# Patient Record
Sex: Female | Born: 1940 | Race: Black or African American | Hispanic: No | Smoking: Never smoker
Health system: Southern US, Community
[De-identification: ages and names within clinical notes are randomized; demographics above are authoritative.]

## PROBLEM LIST (undated history)

## (undated) DIAGNOSIS — M1990 Unspecified osteoarthritis, unspecified site: Secondary | ICD-10-CM

## (undated) DIAGNOSIS — E119 Type 2 diabetes mellitus without complications: Secondary | ICD-10-CM

## (undated) DIAGNOSIS — I10 Essential (primary) hypertension: Secondary | ICD-10-CM

## (undated) DIAGNOSIS — J449 Chronic obstructive pulmonary disease, unspecified: Secondary | ICD-10-CM

## (undated) HISTORY — PX: COLON SURGERY: SHX602

## (undated) HISTORY — PX: ABDOMINAL HYSTERECTOMY: SHX81

## (undated) HISTORY — PX: BREAST SURGERY: SHX581

---

## 2004-03-22 ENCOUNTER — Encounter: Admission: RE | Admit: 2004-03-22 | Discharge: 2004-03-22 | Payer: Self-pay | Admitting: Internal Medicine

## 2017-08-20 ENCOUNTER — Other Ambulatory Visit: Payer: Self-pay

## 2017-08-20 ENCOUNTER — Emergency Department (HOSPITAL_BASED_OUTPATIENT_CLINIC_OR_DEPARTMENT_OTHER)
Admission: EM | Admit: 2017-08-20 | Discharge: 2017-08-20 | Disposition: A | Discharge status: Home / Self Care | Payer: Medicare Other | Attending: Emergency Medicine | Admitting: Emergency Medicine

## 2017-08-20 ENCOUNTER — Emergency Department (HOSPITAL_BASED_OUTPATIENT_CLINIC_OR_DEPARTMENT_OTHER): Payer: Medicare Other

## 2017-08-20 ENCOUNTER — Encounter (HOSPITAL_BASED_OUTPATIENT_CLINIC_OR_DEPARTMENT_OTHER): Payer: Self-pay | Admitting: Emergency Medicine

## 2017-08-20 DIAGNOSIS — M545 Low back pain: Secondary | ICD-10-CM | POA: Diagnosis present

## 2017-08-20 DIAGNOSIS — E119 Type 2 diabetes mellitus without complications: Secondary | ICD-10-CM | POA: Insufficient documentation

## 2017-08-20 DIAGNOSIS — J449 Chronic obstructive pulmonary disease, unspecified: Secondary | ICD-10-CM | POA: Diagnosis not present

## 2017-08-20 DIAGNOSIS — I10 Essential (primary) hypertension: Secondary | ICD-10-CM | POA: Insufficient documentation

## 2017-08-20 DIAGNOSIS — N12 Tubulo-interstitial nephritis, not specified as acute or chronic: Secondary | ICD-10-CM | POA: Diagnosis not present

## 2017-08-20 DIAGNOSIS — Z794 Long term (current) use of insulin: Secondary | ICD-10-CM | POA: Diagnosis not present

## 2017-08-20 HISTORY — DX: Unspecified osteoarthritis, unspecified site: M19.90

## 2017-08-20 HISTORY — DX: Chronic obstructive pulmonary disease, unspecified: J44.9

## 2017-08-20 HISTORY — DX: Type 2 diabetes mellitus without complications: E11.9

## 2017-08-20 HISTORY — DX: Essential (primary) hypertension: I10

## 2017-08-20 LAB — COMPREHENSIVE METABOLIC PANEL
ALT: 16 U/L (ref 14–54)
AST: 17 U/L (ref 15–41)
Albumin: 3.5 g/dL (ref 3.5–5.0)
Alkaline Phosphatase: 79 U/L (ref 38–126)
Anion gap: 9 (ref 5–15)
BUN: 11 mg/dL (ref 6–20)
CO2: 25 mmol/L (ref 22–32)
Calcium: 8.6 mg/dL — ABNORMAL LOW (ref 8.9–10.3)
Chloride: 99 mmol/L — ABNORMAL LOW (ref 101–111)
Creatinine, Ser: 0.88 mg/dL (ref 0.44–1.00)
GFR calc Af Amer: >60 mL/min (ref >60)
GFR calc non Af Amer: >60 mL/min (ref >60)
Glucose, Bld: 253 mg/dL — ABNORMAL HIGH (ref 65–99)
Potassium: 3.4 mmol/L — ABNORMAL LOW (ref 3.5–5.1)
Sodium: 133 mmol/L — ABNORMAL LOW (ref 135–145)
Total Bilirubin: 0.5 mg/dL (ref 0.3–1.2)
Total Protein: 7.1 g/dL (ref 6.5–8.1)

## 2017-08-20 LAB — URINALYSIS, MICROSCOPIC (REFLEX)

## 2017-08-20 LAB — CBC WITH DIFFERENTIAL/PLATELET
Basophils Absolute: 0 10*3/uL (ref 0.0–0.1)
Basophils Relative: 0 %
Eosinophils Absolute: 0.1 10*3/uL (ref 0.0–0.7)
Eosinophils Relative: 1 %
HCT: 33.5 % — ABNORMAL LOW (ref 36.0–46.0)
Hemoglobin: 10.8 g/dL — ABNORMAL LOW (ref 12.0–15.0)
Lymphocytes Relative: 19 %
Lymphs Abs: 2.1 10*3/uL (ref 0.7–4.0)
MCH: 21.6 pg — ABNORMAL LOW (ref 26.0–34.0)
MCHC: 32.2 g/dL (ref 30.0–36.0)
MCV: 67.1 fL — ABNORMAL LOW (ref 78.0–100.0)
Monocytes Absolute: 1.2 10*3/uL — ABNORMAL HIGH (ref 0.1–1.0)
Monocytes Relative: 11 %
Neutro Abs: 7.9 10*3/uL — ABNORMAL HIGH (ref 1.7–7.7)
Neutrophils Relative %: 69 %
Platelets: 212 10*3/uL (ref 150–400)
RBC: 4.99 MIL/uL (ref 3.87–5.11)
RDW: 15.1 % (ref 11.5–15.5)
WBC: 11.3 10*3/uL — ABNORMAL HIGH (ref 4.0–10.5)

## 2017-08-20 LAB — URINALYSIS, ROUTINE W REFLEX MICROSCOPIC
Bilirubin Urine: NEGATIVE
Glucose, UA: >=500 mg/dL — AB
Hgb urine dipstick: NEGATIVE
Ketones, ur: NEGATIVE mg/dL
Leukocytes, UA: NEGATIVE
Nitrite: NEGATIVE
Protein, ur: 30 mg/dL — AB
Specific Gravity, Urine: 1.02 (ref 1.005–1.030)
pH: 7.5 (ref 5.0–8.0)

## 2017-08-20 LAB — LIPASE, BLOOD: Lipase: 22 U/L (ref 11–51)

## 2017-08-20 MED ORDER — CIPROFLOXACIN HCL 500 MG PO TABS: 500.0000 mg | ORAL_TABLET | Freq: Two times a day (BID) | ORAL | 0 refills | Status: AC

## 2017-08-20 MED ORDER — FENTANYL CITRATE (PF) 100 MCG/2ML IJ SOLN
50.0000 ug | Freq: Once | INTRAMUSCULAR | Status: AC
  Administered 2017-08-20: 50 ug via INTRAVENOUS
  Filled 2017-08-20: qty 2

## 2017-08-20 MED ORDER — CIPROFLOXACIN HCL 500 MG PO TABS
500.0000 mg | ORAL_TABLET | Freq: Once | ORAL | Status: AC
  Administered 2017-08-20: 500 mg via ORAL
  Filled 2017-08-20: qty 1

## 2017-08-20 NOTE — ED Provider Notes (Signed)
[facility] EMERGENCY DEPARTMENT Provider Note   CSN: [id number] Arrival date & time: 08/20/17  1648     History   Chief Complaint Chief Complaint  Patient presents with  . Back Pain    HPI Beth Pope is a 77 y.o. female.  The history is provided by the patient, a relative and medical records.  Back Pain   This is a new problem. The current episode started more than 2 days ago. The problem occurs constantly. The problem has not changed since onset.The pain is associated with no known injury. The pain is present in the lumbar spine. The quality of the pain is described as aching. The pain does not radiate. The pain is moderate. The pain is the same all the time. Associated symptoms include a fever (sunjective). Pertinent negatives include no chest pain, no numbness, no headaches, no abdominal pain, no abdominal swelling, no bowel incontinence, no dysuria (foul smelling urine and darker urine), no leg pain, no paresthesias, no paresis, no tingling and no weakness. She has tried nothing for the symptoms. The treatment provided no relief.    Past Medical History:  Diagnosis Date  . Arthritis   . COPD (chronic obstructive pulmonary disease) (HCC)   . Diabetes mellitus without complication (HCC)   . Hypertension     There are no active problems to display for this patient.   Past Surgical History:  Procedure Laterality Date  . ABDOMINAL HYSTERECTOMY    . BREAST SURGERY    . COLON SURGERY      OB History    No data available       Home Medications    Prior to Admission medications   Medication Sig Start Date End Date Taking? Authorizing Provider  albuterol (PROVENTIL HFA;VENTOLIN HFA) 108 (90 Base) MCG/ACT inhaler Inhale into the lungs every 6 (six) hours as needed for wheezing or shortness of breath.   Yes [provider]  amLODipine (NORVASC) 5 MG tablet Take 5 mg by mouth daily.   Yes [provider]  insulin lispro protamine-lispro  (HUMALOG 75/25 MIX) (75-25) 100 UNIT/ML SUSP injection Inject into the skin.   Yes [provider]    Family History History reviewed. No pertinent family history.  Social History Social History   Tobacco Use  . Smoking status: Never Smoker  . Smokeless tobacco: Never Used  Substance Use Topics  . Alcohol use: No    Frequency: Never  . Drug use: No     Allergies   Penicillins; Sulfa antibiotics; and Trulicity [dulaglutide]   Review of Systems Review of Systems  Constitutional: Positive for fever (sunjective). Negative for chills, diaphoresis and fatigue.  HENT: Positive for congestion, rhinorrhea and sneezing.   Respiratory: Positive for cough. Negative for chest tightness, shortness of breath, wheezing and stridor.   Cardiovascular: Negative for chest pain, palpitations and leg swelling.  Gastrointestinal: Negative for abdominal pain, bowel incontinence, constipation, diarrhea, nausea and vomiting.  Genitourinary: Positive for frequency and urgency. Negative for decreased urine volume, difficulty urinating and dysuria (foul smelling urine and darker urine).  Musculoskeletal: Positive for back pain. Negative for neck pain and neck stiffness.  Skin: Negative for rash and wound.  Neurological: Negative for tingling, weakness, light-headedness, numbness, headaches and paresthesias.  Psychiatric/Behavioral: Negative for agitation and confusion.  All other systems reviewed and are negative.    Physical Exam Updated Vital Signs Ht 5\' 3"  (1.6 m)   Wt 103.4 kg (228 lb)   BMI 40.39 kg/m  Physical Exam  Constitutional: She is oriented to person, place, and time. She appears well-developed and well-nourished. No distress.  HENT:  Head: Normocephalic and atraumatic.  Nose: Nose normal.  Mouth/Throat: Oropharynx is clear and moist. No oropharyngeal exudate.  Eyes: Conjunctivae and EOM are normal. Pupils are equal, round, and reactive to light.  Neck: Normal range of  motion.  Cardiovascular: Normal rate and intact distal pulses.  No murmur heard. Pulmonary/Chest: Effort normal. No stridor. No respiratory distress. She has no wheezes. She has no rales. She exhibits no tenderness.  Abdominal: Normal appearance and bowel sounds are normal. She exhibits no distension. There is no tenderness.  Musculoskeletal: She exhibits no edema.       Thoracic back: She exhibits tenderness and pain.       Lumbar back: She exhibits tenderness and pain.       Back:  Neurological: She is alert and oriented to person, place, and time. No sensory deficit. She exhibits normal muscle tone.  Skin: Capillary refill takes less than 2 seconds. No rash noted. She is not diaphoretic. No erythema.  Psychiatric: She has a normal mood and affect.  Nursing note and vitals reviewed.    ED Treatments / Results  Labs (all labs ordered are listed, but only abnormal results are displayed) Labs Reviewed  URINALYSIS, ROUTINE W REFLEX MICROSCOPIC - Abnormal; Notable for the following components:      Result Value   Glucose, UA >=500 (*)    Protein, ur 30 (*)    All other components within normal limits  URINALYSIS, MICROSCOPIC (REFLEX) - Abnormal; Notable for the following components:   Bacteria, UA FEW (*)    Squamous Epithelial / LPF TOO NUMEROUS TO COUNT (*)    All other components within normal limits  CBC WITH DIFFERENTIAL/PLATELET - Abnormal; Notable for the following components:   WBC 11.3 (*)    Hemoglobin 10.8 (*)    HCT 33.5 (*)    MCV 67.1 (*)    MCH 21.6 (*)    Neutro Abs 7.9 (*)    Monocytes Absolute 1.2 (*)    All other components within normal limits  COMPREHENSIVE METABOLIC PANEL - Abnormal; Notable for the following components:   Sodium 133 (*)    Potassium 3.4 (*)    Chloride 99 (*)    Glucose, Bld 253 (*)    Calcium 8.6 (*)    All other components within normal limits  URINE CULTURE  LIPASE, BLOOD  CBC WITH DIFFERENTIAL/PLATELET    EKG  EKG  Interpretation None       Radiology Dg Chest 2 View  Result Date: 08/20/2017 CLINICAL DATA:  Cough and fever EXAM: CHEST - 2 VIEW COMPARISON:  Chest radiograph 01/02/2017 FINDINGS: Unchanged cardiomegaly. Bibasilar atelectasis without focal consolidation. No pleural effusion or pneumothorax. Interstitial prominence is compatible with known interstitial lung disease. IMPRESSION: Unchanged appearance of cardiomegaly with findings of interstitial lung disease. No acute cardiopulmonary abnormality. Electronically Signed   By: Deatra RobinsonKevin  Herman M.D.   On: 08/20/2017 19:18    Procedures Procedures (including critical care time)  Medications Ordered in ED Medications  fentaNYL (SUBLIMAZE) injection 50 mcg (50 mcg Intravenous Given 08/20/17 1813)  ciprofloxacin (CIPRO) tablet 500 mg (500 mg Oral Given 08/20/17 2143)     Initial Impression / Assessment and Plan / ED Course  I have reviewed the triage vital signs and the nursing notes.  Pertinent labs & imaging results that were available during my care of the patient were reviewed  by me and considered in my medical decision making (see chart for details).      Beth Pope is a 77 y.o. female with a past medical history significant for hypertension, COPD on 2.5 L nasal cannula at all times, diabetes, and recent allergic reaction to a diabetes medication who presents with foul-smelling urine, bilateral flank pain, fevers, chills and URI symptoms.  Patient reports that 2 weeks ago she was started on a new diabetes medicine which caused her to have itching all over and rash.  She reports that she stopped the medication and took Benadryl which improved her symptoms.  She says that last Wednesday she began having foul-smelling urine.  She reports that it was darker and more cloudy.  She was concerned she had a UTI.  She reports that over the last several days she has had development of bilateral flank pain and back pain that radiates around her sides.   She reports it is moderate to severe.  She does report subjective fevers and chills.  She denies nausea, vomiting, constipation, or diarrhea.  She does report rhinorrhea, congestion, and dry cough.  She denies any recent injuries.  She denies any other complaints on arrival including no shortness of breath or chest pain.  On exam, patient's lungs clear.  Chest is nontender.  Back is tender in the CVA areas and across the back.  Patient had no abdominal tenderness but had bilateral flank tenderness.  Patient had normal strength and sensation in her legs.  No significant rashes seen.  No other of normality seen on exam.  Based on patient's symptoms I am concerned about bilateral pyelonephritis and UTI.  Also considering muscular skeletal pain in the setting of URI symptoms.  Given the patient's chills and oral temperature of 99, will obtain rectal temp and screening laboratory testing to look for infection.  Patient was given some fentanyl for discomfort during initial work-up.  Anticipate reassessment after work-up.  Patient's laboratory testing returned showing mild leukocytosis.  Mild anemia also present.  Lipase not elevated and metabolic panel overall reassuring.  Urinalysis did not show nitrites or leukocytes however, there was bacteria.  In the setting of the patient's symptoms that clinically are very concerning for UTI, patient will be treated for UTI and pyelonephritis given the pain that radiates towards her bilateral flanks.  Chest x-ray reassuring.  Based on her exam, do not feel patient needs CT scan as I do not think she has infected stones.  Do not feel patient has other intra-abdominal pathology causing her symptoms at this time.  Patient appears well.  Patient also felt a fever during her stay here.  Patient fever improved while in the ED.  Patient given dose of antibiotics.  Family agreed with patient being discharged with outpatient antibiotics as well as PCP follow-up.  They  understood return precautions for any new or worsened symptoms.  Patient had no other worsens or concerns and was discharged in good condition.   Final Clinical Impressions(s) / ED Diagnoses   Final diagnoses:  Pyelonephritis    ED Discharge Orders        Ordered    ciprofloxacin (CIPRO) 500 MG tablet  Every 12 hours     08/20/17 2140      Clinical Impression: 1. Pyelonephritis     Disposition: Discharge  Condition: Good  I have discussed the results, Dx and Tx plan with the pt(& family if present). He/she/they expressed understanding and agree(s) with the plan. Discharge instructions discussed at  great length. Strict return precautions discussed and pt &/or family have verbalized understanding of the instructions. No further questions at time of discharge.    New Prescriptions   CIPROFLOXACIN (CIPRO) 500 MG TABLET    Take 1 tablet (500 mg total) by mouth every 12 (twelve) hours for 7 days.    Follow Up: Endocrinology, [facility] [address] Laurell Josephs [address] [place] [address] [phone]     [facility] [facility]GENCY DEPARTMENT [address] [id number] YQ [facility] [place] [address] [phone]       Kodee Drury, Canary Brim, MD 08/21/17 [phone]

## 2017-08-20 NOTE — ED Notes (Signed)
ED Provider at bedside for re-eval 

## 2017-08-20 NOTE — Discharge Instructions (Signed)
Your workup today showed elevated white blood cell count and bacteria in your urine.  In the setting of your urine symptoms, fever, and bilateral flank pain, we are treating you for pyelonephritis (UTI that spread to your kidneys).  Please take the antibiotics as directed.  Please stay hydrated.  Please follow-up with your primary doctor in the next several days.  If any symptoms change or worsen, please return to the nearest emergency department.

## 2017-08-20 NOTE — ED Triage Notes (Signed)
Patient reports that she has had pain to her bilateral flank down to her hips and legs x 2 - 3 days

## 2017-08-22 LAB — URINE CULTURE

## 2018-08-24 DIAGNOSIS — I6330 Cerebral infarction due to thrombosis of unspecified cerebral artery: Secondary | ICD-10-CM | POA: Diagnosis not present

## 2018-08-24 DIAGNOSIS — I629 Nontraumatic intracranial hemorrhage, unspecified: Secondary | ICD-10-CM

## 2018-08-25 ENCOUNTER — Inpatient Hospital Stay (HOSPITAL_COMMUNITY)
Admission: EM | Admit: 2018-08-25 | Discharge: 2018-10-03 | DRG: 061 | Disposition: E | Payer: Medicare Other | Attending: Neurology | Admitting: Neurology

## 2018-08-25 ENCOUNTER — Emergency Department (HOSPITAL_COMMUNITY): Payer: Medicare Other

## 2018-08-25 DIAGNOSIS — R471 Dysarthria and anarthria: Secondary | ICD-10-CM | POA: Diagnosis present

## 2018-08-25 DIAGNOSIS — R29706 NIHSS score 6: Secondary | ICD-10-CM | POA: Diagnosis present

## 2018-08-25 DIAGNOSIS — T45615A Adverse effect of thrombolytic drugs, initial encounter: Secondary | ICD-10-CM | POA: Diagnosis not present

## 2018-08-25 DIAGNOSIS — Z7189 Other specified counseling: Secondary | ICD-10-CM

## 2018-08-25 DIAGNOSIS — E1165 Type 2 diabetes mellitus with hyperglycemia: Secondary | ICD-10-CM | POA: Diagnosis present

## 2018-08-25 DIAGNOSIS — L8990 Pressure ulcer of unspecified site, unspecified stage: Secondary | ICD-10-CM

## 2018-08-25 DIAGNOSIS — Z794 Long term (current) use of insulin: Secondary | ICD-10-CM | POA: Diagnosis not present

## 2018-08-25 DIAGNOSIS — E669 Obesity, unspecified: Secondary | ICD-10-CM | POA: Diagnosis present

## 2018-08-25 DIAGNOSIS — I639 Cerebral infarction, unspecified: Secondary | ICD-10-CM | POA: Diagnosis present

## 2018-08-25 DIAGNOSIS — Z880 Allergy status to penicillin: Secondary | ICD-10-CM

## 2018-08-25 DIAGNOSIS — Z515 Encounter for palliative care: Secondary | ICD-10-CM | POA: Diagnosis not present

## 2018-08-25 DIAGNOSIS — J449 Chronic obstructive pulmonary disease, unspecified: Secondary | ICD-10-CM | POA: Diagnosis present

## 2018-08-25 DIAGNOSIS — M1990 Unspecified osteoarthritis, unspecified site: Secondary | ICD-10-CM | POA: Diagnosis present

## 2018-08-25 DIAGNOSIS — E870 Hyperosmolality and hypernatremia: Secondary | ICD-10-CM | POA: Diagnosis not present

## 2018-08-25 DIAGNOSIS — Z882 Allergy status to sulfonamides status: Secondary | ICD-10-CM

## 2018-08-25 DIAGNOSIS — I10 Essential (primary) hypertension: Secondary | ICD-10-CM | POA: Diagnosis present

## 2018-08-25 DIAGNOSIS — Z978 Presence of other specified devices: Secondary | ICD-10-CM | POA: Diagnosis present

## 2018-08-25 DIAGNOSIS — R0689 Other abnormalities of breathing: Secondary | ICD-10-CM

## 2018-08-25 DIAGNOSIS — Z888 Allergy status to other drugs, medicaments and biological substances status: Secondary | ICD-10-CM | POA: Diagnosis not present

## 2018-08-25 DIAGNOSIS — E11649 Type 2 diabetes mellitus with hypoglycemia without coma: Secondary | ICD-10-CM | POA: Diagnosis not present

## 2018-08-25 DIAGNOSIS — D649 Anemia, unspecified: Secondary | ICD-10-CM | POA: Diagnosis present

## 2018-08-25 DIAGNOSIS — R29810 Facial weakness: Secondary | ICD-10-CM | POA: Diagnosis present

## 2018-08-25 DIAGNOSIS — N179 Acute kidney failure, unspecified: Secondary | ICD-10-CM | POA: Diagnosis present

## 2018-08-25 DIAGNOSIS — I616 Nontraumatic intracerebral hemorrhage, multiple localized: Secondary | ICD-10-CM | POA: Diagnosis not present

## 2018-08-25 DIAGNOSIS — I63312 Cerebral infarction due to thrombosis of left middle cerebral artery: Secondary | ICD-10-CM | POA: Diagnosis not present

## 2018-08-25 DIAGNOSIS — D696 Thrombocytopenia, unspecified: Secondary | ICD-10-CM | POA: Diagnosis not present

## 2018-08-25 DIAGNOSIS — I161 Hypertensive emergency: Secondary | ICD-10-CM | POA: Diagnosis present

## 2018-08-25 DIAGNOSIS — I609 Nontraumatic subarachnoid hemorrhage, unspecified: Secondary | ICD-10-CM | POA: Diagnosis not present

## 2018-08-25 DIAGNOSIS — J9601 Acute respiratory failure with hypoxia: Secondary | ICD-10-CM | POA: Diagnosis present

## 2018-08-25 DIAGNOSIS — J9600 Acute respiratory failure, unspecified whether with hypoxia or hypercapnia: Secondary | ICD-10-CM | POA: Diagnosis not present

## 2018-08-25 DIAGNOSIS — Z6836 Body mass index (BMI) 36.0-36.9, adult: Secondary | ICD-10-CM | POA: Diagnosis not present

## 2018-08-25 DIAGNOSIS — I619 Nontraumatic intracerebral hemorrhage, unspecified: Secondary | ICD-10-CM

## 2018-08-25 DIAGNOSIS — J9811 Atelectasis: Secondary | ICD-10-CM | POA: Diagnosis not present

## 2018-08-25 DIAGNOSIS — I6300 Cerebral infarction due to thrombosis of unspecified precerebral artery: Secondary | ICD-10-CM | POA: Diagnosis not present

## 2018-08-25 DIAGNOSIS — E876 Hypokalemia: Secondary | ICD-10-CM | POA: Diagnosis not present

## 2018-08-25 DIAGNOSIS — Z9911 Dependence on respirator [ventilator] status: Secondary | ICD-10-CM | POA: Diagnosis not present

## 2018-08-25 DIAGNOSIS — I612 Nontraumatic intracerebral hemorrhage in hemisphere, unspecified: Secondary | ICD-10-CM | POA: Diagnosis not present

## 2018-08-25 DIAGNOSIS — E785 Hyperlipidemia, unspecified: Secondary | ICD-10-CM | POA: Diagnosis present

## 2018-08-25 DIAGNOSIS — R1310 Dysphagia, unspecified: Secondary | ICD-10-CM | POA: Diagnosis present

## 2018-08-25 DIAGNOSIS — Z66 Do not resuscitate: Secondary | ICD-10-CM | POA: Diagnosis not present

## 2018-08-25 DIAGNOSIS — I611 Nontraumatic intracerebral hemorrhage in hemisphere, cortical: Secondary | ICD-10-CM | POA: Diagnosis not present

## 2018-08-25 DIAGNOSIS — R531 Weakness: Secondary | ICD-10-CM

## 2018-08-25 DIAGNOSIS — J9690 Respiratory failure, unspecified, unspecified whether with hypoxia or hypercapnia: Secondary | ICD-10-CM

## 2018-08-25 DIAGNOSIS — Z4659 Encounter for fitting and adjustment of other gastrointestinal appliance and device: Secondary | ICD-10-CM

## 2018-08-25 LAB — COMPREHENSIVE METABOLIC PANEL
ALT: 13 U/L (ref 0–44)
AST: 27 U/L (ref 15–41)
Albumin: 3.8 g/dL (ref 3.5–5.0)
Alkaline Phosphatase: 93 U/L (ref 38–126)
Anion gap: 8 (ref 5–15)
BUN: 8 mg/dL (ref 8–23)
CO2: 28 mmol/L (ref 22–32)
Calcium: 8.9 mg/dL (ref 8.9–10.3)
Chloride: 99 mmol/L (ref 98–111)
Creatinine, Ser: 1.24 mg/dL — ABNORMAL HIGH (ref 0.44–1.00)
GFR calc Af Amer: 48 mL/min — ABNORMAL LOW (ref >60)
GFR calc non Af Amer: 41 mL/min — ABNORMAL LOW (ref >60)
Glucose, Bld: 331 mg/dL — ABNORMAL HIGH (ref 70–99)
Potassium: 5 mmol/L (ref 3.5–5.1)
Sodium: 135 mmol/L (ref 135–145)
Total Bilirubin: 1 mg/dL (ref 0.3–1.2)
Total Protein: 7 g/dL (ref 6.5–8.1)

## 2018-08-25 LAB — DIFFERENTIAL
Abs Immature Granulocytes: 0.02 10*3/uL (ref 0.00–0.07)
Basophils Absolute: 0 10*3/uL (ref 0.0–0.1)
Basophils Relative: 1 %
Eosinophils Absolute: 0.1 10*3/uL (ref 0.0–0.5)
Eosinophils Relative: 2 %
Immature Granulocytes: 0 %
Lymphocytes Relative: 38 %
Lymphs Abs: 2.3 10*3/uL (ref 0.7–4.0)
Monocytes Absolute: 0.6 10*3/uL (ref 0.1–1.0)
Monocytes Relative: 10 %
Neutro Abs: 2.8 10*3/uL (ref 1.7–7.7)
Neutrophils Relative %: 49 %

## 2018-08-25 LAB — CBC
HCT: 41.2 % (ref 36.0–46.0)
Hemoglobin: 12 g/dL (ref 12.0–15.0)
MCH: 20.9 pg — ABNORMAL LOW (ref 26.0–34.0)
MCHC: 29.1 g/dL — ABNORMAL LOW (ref 30.0–36.0)
MCV: 71.9 fL — ABNORMAL LOW (ref 80.0–100.0)
Platelets: 160 10*3/uL (ref 150–400)
RBC: 5.73 MIL/uL — ABNORMAL HIGH (ref 3.87–5.11)
RDW: 14.6 % (ref 11.5–15.5)
WBC: 5.9 10*3/uL (ref 4.0–10.5)
nRBC: 0 % (ref 0.0–0.2)

## 2018-08-25 LAB — APTT: aPTT: 24 seconds (ref 24–36)

## 2018-08-25 LAB — PROTIME-INR
INR: 1 (ref 0.8–1.2)
Prothrombin Time: 13.3 seconds (ref 11.4–15.2)

## 2018-08-25 LAB — ETHANOL: Alcohol, Ethyl (B): <10 mg/dL (ref <10)

## 2018-08-25 LAB — I-STAT CREATININE, ED: Creatinine, Ser: 1.2 mg/dL — ABNORMAL HIGH (ref 0.44–1.00)

## 2018-08-25 MED ORDER — SODIUM CHLORIDE 0.9 % IV SOLN
INTRAVENOUS | Status: DC
  Administered 2018-08-25 – 2018-09-02 (×10): via INTRAVENOUS

## 2018-08-25 MED ORDER — STROKE: EARLY STAGES OF RECOVERY BOOK: Freq: Once | Status: DC

## 2018-08-25 MED ORDER — PANTOPRAZOLE SODIUM 40 MG IV SOLR
40.0000 mg | Freq: Every day | INTRAVENOUS | Status: DC
  Administered 2018-08-26 – 2018-08-27 (×2): 40 mg via INTRAVENOUS
  Filled 2018-08-25 (×2): qty 40

## 2018-08-25 MED ORDER — LABETALOL HCL 5 MG/ML IV SOLN
10.0000 mg | Freq: Once | INTRAVENOUS | Status: AC
  Administered 2018-08-25: 10 mg via INTRAVENOUS
  Filled 2018-08-25: qty 4

## 2018-08-25 MED ORDER — ACETAMINOPHEN 325 MG PO TABS: 650.0000 mg | ORAL_TABLET | ORAL | Status: DC | PRN

## 2018-08-25 MED ORDER — ACETAMINOPHEN 650 MG RE SUPP: 650.0000 mg | RECTAL | Status: DC | PRN

## 2018-08-25 MED ORDER — SODIUM CHLORIDE 0.9 % IV SOLN
50.0000 mL | Freq: Once | INTRAVENOUS | Status: AC
  Administered 2018-08-25: 50 mL via INTRAVENOUS

## 2018-08-25 MED ORDER — CLEVIDIPINE BUTYRATE 0.5 MG/ML IV EMUL
0.0000 mg/h | INTRAVENOUS | Status: DC
  Administered 2018-08-26: 10 mg/h via INTRAVENOUS
  Administered 2018-08-26: 12 mg/h via INTRAVENOUS
  Administered 2018-08-26: 15 mg/h via INTRAVENOUS
  Administered 2018-08-26: 12 mg/h via INTRAVENOUS
  Administered 2018-08-26: 21 mg/h via INTRAVENOUS
  Administered 2018-08-26: 15 mg/h via INTRAVENOUS
  Administered 2018-08-26: 6 mg/h via INTRAVENOUS
  Administered 2018-08-26: 14 mg/h via INTRAVENOUS
  Administered 2018-08-26 (×2): 12 mg/h via INTRAVENOUS
  Administered 2018-08-26: 8 mg/h via INTRAVENOUS
  Administered 2018-08-27: 16 mg/h via INTRAVENOUS
  Administered 2018-08-27: 8 mg/h via INTRAVENOUS
  Administered 2018-08-27: 17 mg/h via INTRAVENOUS
  Administered 2018-08-27: 3 mg/h via INTRAVENOUS
  Administered 2018-08-28: 4 mg/h via INTRAVENOUS
  Filled 2018-08-25 (×4): qty 50
  Filled 2018-08-25: qty 100
  Filled 2018-08-25 (×11): qty 50

## 2018-08-25 MED ORDER — ACETAMINOPHEN 160 MG/5ML PO SOLN
650.0000 mg | ORAL | Status: DC | PRN
  Administered 2018-08-26 – 2018-09-01 (×9): 650 mg
  Filled 2018-08-25 (×9): qty 20.3

## 2018-08-25 MED ORDER — LABETALOL HCL 5 MG/ML IV SOLN
10.0000 mg | Freq: Once | INTRAVENOUS | Status: AC
  Administered 2018-08-25: 10 mg via INTRAVENOUS

## 2018-08-25 MED ORDER — ALTEPLASE (STROKE) FULL DOSE INFUSION
0.9000 mg/kg | Freq: Once | INTRAVENOUS | Status: AC
  Administered 2018-08-25: 79.7 mg via INTRAVENOUS
  Filled 2018-08-25: qty 100

## 2018-08-25 MED ORDER — LABETALOL HCL 5 MG/ML IV SOLN
INTRAVENOUS | Status: AC
  Administered 2018-08-25: 22:00:00
  Filled 2018-08-25: qty 4

## 2018-08-25 MED ORDER — SENNOSIDES-DOCUSATE SODIUM 8.6-50 MG PO TABS
1.0000 | ORAL_TABLET | Freq: Every evening | ORAL | Status: DC | PRN
  Administered 2018-09-01 – 2018-09-02 (×3): 1 via ORAL
  Filled 2018-08-25 (×3): qty 1

## 2018-08-25 NOTE — ED Notes (Signed)
Patient actual arrival is 2216, patient's name was moved into room before arrival.

## 2018-08-25 NOTE — Progress Notes (Signed)
PHARMACIST CODE STROKE RESPONSE  Notified to mix tPA at 10:25 by Dr. Laurence Slate Delivered tPA to RN at 10:29  tPA dose = 8 mg bolus over 1 minute followed by 71.7 mg for a total dose of 79.7 mg over 1 hour  Issues/delays encountered (if applicable): patient BP confirmed on right arm 128/100 mmHg (after patient received 10 mg IV labetalol) prior to administering tPA.   [place] 08/04/2018 10:51 PM

## 2018-08-25 NOTE — ED Provider Notes (Addendum)
[facility] EMERGENCY DEPARTMENT Provider Note   CSN: [id number] Arrival date & time: 08/30/2018  2212    History   Chief Complaint Chief Complaint  Patient presents with   Code Stroke    LEVEL 5 CAVEAT 2/2 ACUITY OF CONDITION   HPI Tam Savoia is a 78 y.o. female.     78 y/o female with hx of DM, COPD, HTN presents to the ED for R sided weakness. Patient reports onset of symptoms at 2000 while doing laundry. States that she initially felt a tingling sensation in her hand with radiated up to her arm. Noted subsequent weakness and numbness on her right side. Patient called her daughter who then called EMS. She is presently c/o a left parietal headache. No syncope prior to arrival.  Denies associated chest pain, SOB, back pain, N/V.  The patient is not on chronic anticoagulation.  The history is provided by the patient. No language interpreter was used.    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      Home Medications    Prior to Admission medications   Medication Sig Start Date End Date Taking? Authorizing Provider  albuterol (PROVENTIL HFA;VENTOLIN HFA) 108 (90 Base) MCG/ACT inhaler Inhale into the lungs every 6 (six) hours as needed for wheezing or shortness of breath.    [provider]  amLODipine (NORVASC) 5 MG tablet Take 5 mg by mouth daily.    [provider]  insulin lispro protamine-lispro (HUMALOG 75/25 MIX) (75-25) 100 UNIT/ML SUSP injection Inject into the skin.    [provider]    Family History No family  history on file.  Social History Social History   Tobacco Use   Smoking status: Never Smoker   Smokeless tobacco: Never Used  Substance Use Topics   Alcohol use: No    Frequency: Never   Drug use: No     Allergies   Penicillins; Sulfa antibiotics; and Trulicity [dulaglutide]   Review of Systems Review of Systems  Unable to perform ROS: Acuity of condition     Physical Exam Updated Vital Signs BP (!) 146/60    Pulse 94    Resp 16    Ht  (1.6 m)    Wt 88.6 kg    SpO2 100%    BMI 34.60 kg/m   Physical Exam Vitals signs and nursing note reviewed.  Constitutional:      General: She is not in acute distress.    Appearance: She is well-developed. She is not diaphoretic.     Comments: Obese female, in NAD  HENT:     Head: Normocephalic and atraumatic.  Eyes:     General: No scleral icterus.    Conjunctiva/sclera: Conjunctivae normal.  Neck:     Musculoskeletal: Normal range of motion.  Cardiovascular:     Rate and Rhythm: Normal rate and regular rhythm.     Pulses: Normal pulses.  Pulmonary:     Effort: Pulmonary effort is normal. No respiratory distress.  Comments: Respirations even and unlabored. Lungs CTAB. Musculoskeletal: Normal range of motion.  Skin:    General: Skin is warm and dry.     Coloration: Skin is not pale.     Findings: No erythema or rash.  Neurological:     Mental Status: She is alert and oriented to person, place, and time.     Cranial Nerves: Dysarthria present.     Motor: Weakness present.     Comments: Mild dysarthria with slight downturning to the right corner of the mouth. Good bilateral grip strength on my assessment. 4/5 strength in the RLE with 5/5 strength on the left. Defer to Neurology note for more detailed exam.  Psychiatric:        Behavior: Behavior normal.      ED Treatments / Results  Labs (all labs ordered are listed, but only abnormal results are displayed) Labs Reviewed  CBC - Abnormal; Notable for the  following components:      Result Value   RBC 5.73 (*)    MCV 71.9 (*)    MCH 20.9 (*)    MCHC 29.1 (*)    All other components within normal limits  COMPREHENSIVE METABOLIC PANEL - Abnormal; Notable for the following components:   Glucose, Bld 331 (*)    Creatinine, Ser 1.24 (*)    GFR calc non Af Amer 41 (*)    GFR calc Af Amer 48 (*)    All other components within normal limits  I-STAT CREATININE, ED - Abnormal; Notable for the following components:   Creatinine, Ser 1.20 (*)    All other components within normal limits  DIFFERENTIAL  ETHANOL  PROTIME-INR  APTT  RAPID URINE DRUG SCREEN, HOSP PERFORMED  URINALYSIS, ROUTINE W REFLEX MICROSCOPIC  HEMOGLOBIN A1C  LIPID PANEL  FIBRINOGEN  TRIGLYCERIDES  TYPE AND SCREEN  ABO/RH  PREPARE CRYOPRECIPITATE    EKG EKG Interpretation  Date/Time:  Monday August 25 2018 23:11:44 EDT Ventricular Rate:  74 PR Interval:    QRS Duration: 92 QT Interval:  417 QTC Calculation: 463 R Axis:   27 Text Interpretation:  Sinus rhythm Confirmed by Zadie Rhine ([id number]) on 08/26/2018 12:09:00 AM   Radiology Ct Head Wo Contrast  Addendum Date: 08/26/2018   ADDENDUM REPORT: 08/26/2018 00:37 ADDENDUM: Critical Value/emergent results were called by telephone at the time of interpretation on 08/26/2018 at 12:36 am to Dr. Georgiana Spinner Aroor , who verbally acknowledged these results. Electronically Signed   By: Deatra Robinson M.D.   On: 08/26/2018 00:37   Result Date: 08/26/2018 CLINICAL DATA:  Status post tPA EXAM: CT HEAD WITHOUT CONTRAST TECHNIQUE: Contiguous axial images were obtained from the base of the skull through the vertex without intravenous contrast. COMPARISON:  None. FINDINGS: Brain: There is multifocal subarachnoid and parenchymal hemorrhage within both hemispheres. Largest area hemorrhages in the left frontal lobe, measuring approximately 1.6 x 1.0 x 1.6 cm (volume = 1.3 cm^3). Smaller foci of hemorrhage are located within both occipital  lobes. The subarachnoid blood is greatest over the right parietal convexity. No midline shift or other mass effect. There is periventricular hypoattenuation compatible with chronic microvascular disease. Vascular: No abnormal hyperdensity of the major intracranial arteries or dural venous sinuses. No intracranial atherosclerosis. Skull: The visualized skull base, calvarium and extracranial soft tissues are normal. Sinuses/Orbits: No fluid levels or advanced mucosal thickening of the visualized paranasal sinuses. No mastoid or middle ear effusion. The orbits are normal. IMPRESSION: Multifocal acute intraparenchymal and subarachnoid hemorrhage involving both hemispheres, greatest  in the left frontal lobe. No associated mass effect. Electronically Signed: By: Deatra Robinson M.D. On: 08/26/2018 00:28   Dg Chest Port 1 View  Result Date: 08/26/2018 CLINICAL DATA:  Intubation. EXAM: PORTABLE CHEST 1 VIEW COMPARISON:  02/14/2018 FINDINGS: Endotracheal tube tip slightly low in positioning 16 mm from the carina. Tip and side port of the enteric tube below the diaphragm in the stomach. Low lung volumes. Unchanged heart size and mediastinal contours with aortic tortuosity. Bibasilar atelectasis. No evidence of pulmonary edema, large pleural effusion or pneumothorax. Scattered peripheral fibrosis. IMPRESSION: 1. Endotracheal tube tip slightly low in positioning 16 mm from the carina, consider retraction of 1-2 cm. Enteric tube in place. 2. Low lung volumes with bibasilar atelectasis. Electronically Signed   By: Narda Rutherford M.D.   On: 08/26/2018 00:50   Ct Head Code Stroke Wo Contrast  Result Date: 2018-08-29 CLINICAL DATA:  Code stroke. Right-sided weakness. Last seen normal 21:30. EXAM: CT HEAD WITHOUT CONTRAST TECHNIQUE: Contiguous axial images were obtained from the base of the skull through the vertex without intravenous contrast. COMPARISON:  02/10/2018 FINDINGS: Brain: There is no mass, hemorrhage or  extra-axial collection. The size and configuration of the ventricles and extra-axial CSF spaces are normal. There is hypoattenuation of the periventricular white matter, most commonly indicating chronic ischemic microangiopathy. Vascular: No abnormal hyperdensity of the major intracranial arteries or dural venous sinuses. No intracranial atherosclerosis. Skull: The visualized skull base, calvarium and extracranial soft tissues are normal. Sinuses/Orbits: No fluid levels or advanced mucosal thickening of the visualized paranasal sinuses. No mastoid or middle ear effusion. The orbits are normal. ASPECTS [facility] Stroke Program Early CT Score) - Ganglionic level infarction (caudate, lentiform nuclei, internal capsule, insula, M1-M3 cortex): 7 - Supraganglionic infarction (M4-M6 cortex): 3 Total score (0-10 with 10 being normal): 10 IMPRESSION: 1. No acute intracranial abnormality. 2. ASPECTS is 10. * These results were communicated to Dr. Georgiana Spinner Aroor at 10:31 pm on 2018/08/29 by text page via the [facility] messaging system. Electronically Signed   By: Deatra Robinson M.D.   On: Aug 29, 2018 22:32    Procedures .Critical Care Performed by: Antony Madura, PA-C Authorized by: Antony Madura, PA-C   Critical care provider statement:    Critical care time (minutes):  45   Critical care was time spent personally by me on the following activities:  Discussions with consultants, evaluation of patient's response to treatment, examination of patient, ordering and performing treatments and interventions, ordering and review of laboratory studies, ordering and review of radiographic studies, pulse oximetry, re-evaluation of patient's condition, obtaining history from patient or surrogate and review of old charts   (including critical care time)  Medications Ordered in ED Medications   stroke: mapping our early stages of recovery book (has no administration in time range)  0.9 %  sodium chloride infusion ( Intravenous New  Bag/Given 08-29-18 2313)  acetaminophen (TYLENOL) tablet 650 mg (has no administration in time range)    Or  acetaminophen (TYLENOL) solution 650 mg (has no administration in time range)    Or  acetaminophen (TYLENOL) suppository 650 mg (has no administration in time range)  senna-docusate (Senokot-S) tablet 1 tablet (has no administration in time range)  pantoprazole (PROTONIX) injection 40 mg (40 mg Intravenous Not Given 08/26/18 0042)  clevidipine (CLEVIPREX) infusion 0.5 mg/mL (21 mg/hr Intravenous New Bag/Given 08/26/18 0117)  methylPREDNISolone sodium succinate (SOLU-MEDROL) 125 mg/2 mL injection 125 mg (125 mg Intravenous Not Given 08/26/18 0037)  diphenhydrAMINE (BENADRYL) injection 50 mg (50  mg Intravenous Not Given 08/26/18 0037)  famotidine (PEPCID) IVPB 20 mg in NS 100 mL IVPB (20 mg Intravenous Not Given 08/26/18 0037)  labetalol (NORMODYNE,TRANDATE) 5 MG/ML injection (  Not Given 08/26/18 0041)  0.9 %  sodium chloride infusion (has no administration in time range)  propofol (DIPRIVAN) 1000 MG/100ML infusion (  Not Given 08/26/18 0044)  propofol (DIPRIVAN) 1000 MG/100ML infusion (has no administration in time range)  levETIRAcetam (KEPPRA) IVPB 500 mg/100 mL premix (has no administration in time range)  labetalol (NORMODYNE,TRANDATE) 5 MG/ML injection (  Given 2018/09/02 2223)  alteplase (ACTIVASE) 1 mg/mL infusion 79.7 mg (0 mg Intravenous Stopped 09/02/18 2338)    Followed by  0.9 %  sodium chloride infusion (50 mLs Intravenous New Bag/Given 09/02/2018 2348)  labetalol (NORMODYNE,TRANDATE) injection 10 mg (10 mg Intravenous Given 02-Sep-2018 2346)  labetalol (NORMODYNE,TRANDATE) injection 10 mg (10 mg Intravenous Given 02-Sep-2018 2320)  labetalol (NORMODYNE,TRANDATE) injection 10 mg (10 mg Intravenous Given by Other 08/26/18 0035)  tranexamic acid (CYKLOKAPRON) IVPB 1,000 mg (1,000 mg Intravenous New Bag/Given 08/26/18 0018)  etomidate (AMIDATE) injection (20 mg Intravenous Given 08/26/18 0023)    labetalol (NORMODYNE,TRANDATE) injection 10 mg (10 mg Intravenous Given by Other 08/26/18 0047)    11:02 PM NIH 6 on arrival, per Dr. Laurence Slate. Currently receiving TPA.   Initial Impression / Assessment and Plan / ED Course  I have reviewed the triage vital signs and the nursing notes.  Pertinent labs & imaging results that were available during my care of the patient were reviewed by me and considered in my medical decision making (see chart for details).        78 y/o female presents to the ED as a CODE STROKE. Seen at the bridge by Neurology and brought to CT imaging. Initial CT reassuring with associated NIH stroke scale of 6. TPA initiated by Neurology. Plan for admission to Neuro ICU.  11:58 PM Called to patient's room by RN over concern for patient decompensation. Patient mildly diaphoretic complaining of odynophagia. Appears dyspneic without significant tachypnea. Lungs remain CTAB. SpO2 100% on room air. TPA completed. She has received a total of 30mg  labetolol for management of HTN. Cleviprex not yet started.   Neurology called back to bedside. Patient transported back to CT for STAT CT scan.   12:13 AM Patient with evidence of ICH. Will continue with BP management. 10mg  labetalol ordered by Dr. Laurence Slate. 1g IV TXA ordered. Dr. Bebe Shaggy to bedside to assess patient.  12:31 AM Patient successfully intubated by MD Wickline. Orders for Propofol gtt placed for sedation. Dr. Laurence Slate would like an additional head CT to assess extent/progression of ICH following TXA.  12:44 AM RT called to retract ET tube 1cm. Will continue with BP control; current systolic at 151 with Labetalol boluses and Cleviprex gtt. Propofol titrated to assist in BP management.  1:21 AM BP at 139 systolic. Plan for repeat CT and transfer to ICU floor.   Vitals:   September 02, 2018 2346 Sep 02, 2018 2348 08/26/18 0031 08/26/18 0046  BP: (!) 169/91 (!) 169/91 (!) 239/118 (!) 146/60  Pulse: 77  94   Resp:   16   SpO2:    100%   Weight:      Height:        Final Clinical Impressions(s) / ED Diagnoses   Final diagnoses:  Right sided weakness  Dysarthria    ED Discharge Orders    None       Antony Madura, PA-C 2018-09-02 2342  Antony Madura, PA-C 08/26/18 Lyn Records, MD 08/26/18 [phone]

## 2018-08-25 NOTE — ED Notes (Signed)
PT, ATT hemolyzed per lab.  New orders and phlebotomy to obtain new sample.

## 2018-08-26 ENCOUNTER — Inpatient Hospital Stay (HOSPITAL_COMMUNITY): Payer: Medicare Other

## 2018-08-26 DIAGNOSIS — Z978 Presence of other specified devices: Secondary | ICD-10-CM | POA: Diagnosis present

## 2018-08-26 DIAGNOSIS — I63312 Cerebral infarction due to thrombosis of left middle cerebral artery: Secondary | ICD-10-CM

## 2018-08-26 DIAGNOSIS — I10 Essential (primary) hypertension: Secondary | ICD-10-CM

## 2018-08-26 DIAGNOSIS — J449 Chronic obstructive pulmonary disease, unspecified: Secondary | ICD-10-CM | POA: Diagnosis present

## 2018-08-26 DIAGNOSIS — I619 Nontraumatic intracerebral hemorrhage, unspecified: Secondary | ICD-10-CM

## 2018-08-26 DIAGNOSIS — I616 Nontraumatic intracerebral hemorrhage, multiple localized: Secondary | ICD-10-CM

## 2018-08-26 DIAGNOSIS — I639 Cerebral infarction, unspecified: Secondary | ICD-10-CM

## 2018-08-26 LAB — ECHOCARDIOGRAM COMPLETE
Height: 63 in
WEIGHTICAEL: 3125.24 [oz_av]

## 2018-08-26 LAB — GLUCOSE, CAPILLARY
GLUCOSE-CAPILLARY: 258 mg/dL — AB (ref 70–99)
Glucose-Capillary: 199 mg/dL — ABNORMAL HIGH (ref 70–99)
Glucose-Capillary: 224 mg/dL — ABNORMAL HIGH (ref 70–99)
Glucose-Capillary: 228 mg/dL — ABNORMAL HIGH (ref 70–99)
Glucose-Capillary: 322 mg/dL — ABNORMAL HIGH (ref 70–99)

## 2018-08-26 LAB — PHOSPHORUS: Phosphorus: 3.2 mg/dL (ref 2.5–4.6)

## 2018-08-26 LAB — POCT I-STAT 7, (LYTES, BLD GAS, ICA,H+H)
Acid-Base Excess: 1 mmol/L (ref 0.0–2.0)
Bicarbonate: 25.8 mmol/L (ref 20.0–28.0)
CALCIUM ION: 1.18 mmol/L (ref 1.15–1.40)
HCT: 41 % (ref 36.0–46.0)
Hemoglobin: 13.9 g/dL (ref 12.0–15.0)
O2 Saturation: 96 %
Patient temperature: 98.6
Potassium: 4.1 mmol/L (ref 3.5–5.1)
SODIUM: 138 mmol/L (ref 135–145)
TCO2: 27 mmol/L (ref 22–32)
pCO2 arterial: 41.3 mmHg (ref 32.0–48.0)
pH, Arterial: 7.404 (ref 7.350–7.450)
pO2, Arterial: 85 mmHg (ref 83.0–108.0)

## 2018-08-26 LAB — LIPID PANEL
CHOL/HDL RATIO: 4.7 ratio
CHOLESTEROL: 178 mg/dL (ref 0–200)
HDL: 38 mg/dL — ABNORMAL LOW (ref >40)
LDL Cholesterol: 102 mg/dL — ABNORMAL HIGH (ref 0–99)
Triglycerides: 191 mg/dL — ABNORMAL HIGH (ref <150)
VLDL: 38 mg/dL (ref 0–40)

## 2018-08-26 LAB — MAGNESIUM: Magnesium: 1.7 mg/dL (ref 1.7–2.4)

## 2018-08-26 LAB — TYPE AND SCREEN
ABO/RH(D): O POS
Antibody Screen: NEGATIVE

## 2018-08-26 LAB — ABO/RH: ABO/RH(D): O POS

## 2018-08-26 LAB — MRSA PCR SCREENING: MRSA by PCR: NEGATIVE

## 2018-08-26 LAB — HEMOGLOBIN A1C
Hgb A1c MFr Bld: 10.4 % — ABNORMAL HIGH (ref 4.8–5.6)
Mean Plasma Glucose: 251.78 mg/dL

## 2018-08-26 LAB — FIBRINOGEN: Fibrinogen: 336 mg/dL (ref 210–475)

## 2018-08-26 LAB — TRIGLYCERIDES: Triglycerides: 113 mg/dL (ref <150)

## 2018-08-26 MED ORDER — LABETALOL HCL 5 MG/ML IV SOLN
INTRAVENOUS | Status: AC
  Filled 2018-08-26: qty 4

## 2018-08-26 MED ORDER — FENTANYL CITRATE (PF) 100 MCG/2ML IJ SOLN
50.0000 ug | Freq: Once | INTRAMUSCULAR | Status: AC
  Administered 2018-08-26: 50 ug via INTRAVENOUS

## 2018-08-26 MED ORDER — PROPOFOL 1000 MG/100ML IV EMUL
0.0000 ug/kg/min | INTRAVENOUS | Status: DC
  Administered 2018-08-26 (×2): 30 ug/kg/min via INTRAVENOUS
  Administered 2018-08-26: 5 ug/kg/min via INTRAVENOUS
  Administered 2018-08-27 – 2018-08-28 (×2): 15 ug/kg/min via INTRAVENOUS
  Administered 2018-08-31: 20 ug/kg/min via INTRAVENOUS
  Administered 2018-09-01: 15 ug/kg/min via INTRAVENOUS
  Administered 2018-09-01: 20 ug/kg/min via INTRAVENOUS
  Administered 2018-09-01: 10 ug/kg/min via INTRAVENOUS
  Administered 2018-09-02: 20 ug/kg/min via INTRAVENOUS
  Administered 2018-09-02: 5 ug/kg/min via INTRAVENOUS
  Filled 2018-08-26 (×6): qty 100
  Filled 2018-08-26: qty 200
  Filled 2018-08-26 (×3): qty 100

## 2018-08-26 MED ORDER — FENTANYL CITRATE (PF) 100 MCG/2ML IJ SOLN
50.0000 ug | INTRAMUSCULAR | Status: AC | PRN
  Administered 2018-08-27 – 2018-08-29 (×3): 50 ug via INTRAVENOUS
  Filled 2018-08-26 (×4): qty 2

## 2018-08-26 MED ORDER — SODIUM CHLORIDE 0.9 % IV SOLN: Freq: Once | INTRAVENOUS | Status: DC

## 2018-08-26 MED ORDER — LABETALOL HCL 5 MG/ML IV SOLN
10.0000 mg | Freq: Once | INTRAVENOUS | Status: AC
  Administered 2018-08-26: 10 mg via INTRAVENOUS

## 2018-08-26 MED ORDER — INSULIN ASPART 100 UNIT/ML ~~LOC~~ SOLN: 0.0000 [IU] | Freq: Three times a day (TID) | SUBCUTANEOUS | Status: DC

## 2018-08-26 MED ORDER — LABETALOL HCL 5 MG/ML IV SOLN
10.0000 mg | Freq: Once | INTRAVENOUS | Status: AC
  Administered 2018-08-26: 10 mg via INTRAVENOUS
  Filled 2018-08-26: qty 4

## 2018-08-26 MED ORDER — METHYLPREDNISOLONE SODIUM SUCC 125 MG IJ SOLR: 125.0000 mg | INTRAMUSCULAR | Status: DC

## 2018-08-26 MED ORDER — DIPHENHYDRAMINE HCL 50 MG/ML IJ SOLN: 50.0000 mg | INTRAMUSCULAR | Status: DC

## 2018-08-26 MED ORDER — INSULIN ASPART 100 UNIT/ML ~~LOC~~ SOLN: 0.0000 [IU] | Freq: Every day | SUBCUTANEOUS | Status: DC

## 2018-08-26 MED ORDER — PRO-STAT SUGAR FREE PO LIQD
30.0000 mL | Freq: Every day | ORAL | Status: DC
  Administered 2018-08-26 – 2018-09-01 (×31): 30 mL
  Filled 2018-08-26 (×27): qty 30

## 2018-08-26 MED ORDER — CHLORHEXIDINE GLUCONATE 0.12% ORAL RINSE (MEDLINE KIT)
15.0000 mL | Freq: Two times a day (BID) | OROMUCOSAL | Status: DC
  Administered 2018-08-26 – 2018-09-03 (×17): 15 mL via OROMUCOSAL

## 2018-08-26 MED ORDER — FENTANYL CITRATE (PF) 100 MCG/2ML IJ SOLN
50.0000 ug | INTRAMUSCULAR | Status: DC | PRN
  Administered 2018-08-28 – 2018-08-30 (×8): 50 ug via INTRAVENOUS
  Filled 2018-08-26 (×8): qty 2

## 2018-08-26 MED ORDER — LEVETIRACETAM IN NACL 500 MG/100ML IV SOLN
500.0000 mg | Freq: Two times a day (BID) | INTRAVENOUS | Status: DC
  Administered 2018-08-26 – 2018-08-27 (×3): 500 mg via INTRAVENOUS
  Filled 2018-08-26 (×3): qty 100

## 2018-08-26 MED ORDER — VITAL HIGH PROTEIN PO LIQD
1000.0000 mL | ORAL | Status: DC
  Administered 2018-08-26 – 2018-08-31 (×6): 1000 mL

## 2018-08-26 MED ORDER — IPRATROPIUM-ALBUTEROL 0.5-2.5 (3) MG/3ML IN SOLN
3.0000 mL | Freq: Four times a day (QID) | RESPIRATORY_TRACT | Status: DC
  Administered 2018-08-26 – 2018-08-29 (×15): 3 mL via RESPIRATORY_TRACT
  Filled 2018-08-26 (×15): qty 3

## 2018-08-26 MED ORDER — ADULT MULTIVITAMIN W/MINERALS CH
1.0000 | ORAL_TABLET | Freq: Every day | ORAL | Status: DC
  Administered 2018-08-26 – 2018-09-02 (×8): 1
  Filled 2018-08-26 (×8): qty 1

## 2018-08-26 MED ORDER — FENTANYL CITRATE (PF) 100 MCG/2ML IJ SOLN
INTRAMUSCULAR | Status: AC
  Administered 2018-08-26: 50 ug via INTRAVENOUS
  Filled 2018-08-26: qty 2

## 2018-08-26 MED ORDER — FAMOTIDINE 20 MG IN NS 100 ML IVPB: 20.0000 mg | INTRAVENOUS | Status: DC

## 2018-08-26 MED ORDER — INSULIN GLARGINE 100 UNIT/ML ~~LOC~~ SOLN
8.0000 [IU] | Freq: Two times a day (BID) | SUBCUTANEOUS | Status: DC
  Administered 2018-08-26 – 2018-08-27 (×3): 8 [IU] via SUBCUTANEOUS
  Filled 2018-08-26 (×3): qty 0.08

## 2018-08-26 MED ORDER — TRANEXAMIC ACID-NACL 1000-0.7 MG/100ML-% IV SOLN
1000.0000 mg | INTRAVENOUS | Status: AC
  Administered 2018-08-26: 1000 mg via INTRAVENOUS

## 2018-08-26 MED ORDER — ETOMIDATE 2 MG/ML IV SOLN
INTRAVENOUS | Status: AC | PRN
  Administered 2018-08-26: 20 mg via INTRAVENOUS

## 2018-08-26 MED ORDER — TRANEXAMIC ACID 1000 MG/10ML IV SOLN: 1000.0000 mg | Freq: Once | INTRAVENOUS | Status: DC

## 2018-08-26 MED ORDER — ORAL CARE MOUTH RINSE
15.0000 mL | OROMUCOSAL | Status: DC
  Administered 2018-08-26 – 2018-09-03 (×82): 15 mL via OROMUCOSAL

## 2018-08-26 MED ORDER — PROPOFOL 1000 MG/100ML IV EMUL
INTRAVENOUS | Status: AC
  Filled 2018-08-26: qty 100

## 2018-08-26 MED ORDER — INSULIN ASPART 100 UNIT/ML ~~LOC~~ SOLN
0.0000 [IU] | SUBCUTANEOUS | Status: DC
  Administered 2018-08-26: 8 [IU] via SUBCUTANEOUS
  Administered 2018-08-26: 5 [IU] via SUBCUTANEOUS
  Administered 2018-08-26: 3 [IU] via SUBCUTANEOUS
  Administered 2018-08-26: 11 [IU] via SUBCUTANEOUS
  Administered 2018-08-27 (×2): 5 [IU] via SUBCUTANEOUS
  Administered 2018-08-27: 2 [IU] via SUBCUTANEOUS
  Administered 2018-08-27: 3 [IU] via SUBCUTANEOUS
  Administered 2018-08-27 (×2): 8 [IU] via SUBCUTANEOUS
  Administered 2018-08-28: 3 [IU] via SUBCUTANEOUS
  Administered 2018-08-28: 8 [IU] via SUBCUTANEOUS
  Administered 2018-08-28: 3 [IU] via SUBCUTANEOUS
  Administered 2018-08-28: 5 [IU] via SUBCUTANEOUS
  Administered 2018-08-28 – 2018-08-29 (×4): 3 [IU] via SUBCUTANEOUS
  Administered 2018-08-29 (×2): 2 [IU] via SUBCUTANEOUS
  Administered 2018-08-30 (×3): 3 [IU] via SUBCUTANEOUS
  Administered 2018-08-31: 2 [IU] via SUBCUTANEOUS
  Administered 2018-08-31: 3 [IU] via SUBCUTANEOUS
  Administered 2018-08-31 – 2018-09-01 (×2): 2 [IU] via SUBCUTANEOUS
  Administered 2018-09-01 – 2018-09-02 (×4): 3 [IU] via SUBCUTANEOUS

## 2018-08-26 NOTE — ED Notes (Signed)
Spoke to PA to bedside at 2342 due to pt's status change. Pt began to have shortness of breath and sweating suddenly. PA advised to call Neuro MD. MD called at 2349 and stated he would come see pt at bedside

## 2018-08-26 NOTE — Progress Notes (Signed)
eLink Physician-Brief Progress Note Patient Name: Beth Pope DOB: Jun 20, 1940 MRN: [id number]   Date of Service  08/26/2018  HPI/Events of Note  Increase in temp in the context of CVA with hemorrhagic conversion. Temp initially did not respond to Tylenol but has normalized with ice pack.  eICU Interventions  No intervention currently. If temp spike recurs and is resistant to tylenol will order cooling blanket.        Thomasene Lot Ogan 08/26/2018, 11:10 PM

## 2018-08-26 NOTE — Progress Notes (Signed)
Carotid artery duplex has been completed. Preliminary results can be found in CV Proc through chart review.   08/26/18 11:23 AM Olen Cordial RVT

## 2018-08-26 NOTE — ED Notes (Signed)
Unable to assess final TPA 15 minute assessment due to pt being intubated

## 2018-08-26 NOTE — Progress Notes (Signed)
OT Cancellation Note  Patient Details Name: Beth Pope MRN: [id number] DOB: [birth year]   Cancelled Treatment:    Reason Eval/Treat Not Completed: Patient not medically ready Pt intubated due to large ICH s/p receiving tPA. Acute OT to return as able, if/when appropriate.  Ignacia Palma, OTR/L Acute Rehab Services Pager [phone] Office [phone]     [name] 08/26/2018, 7:47 AM

## 2018-08-26 NOTE — Progress Notes (Signed)
Initial Nutrition Assessment RD working remotely.  DOCUMENTATION CODES:   Obesity unspecified  INTERVENTION:   Initiate via OG tube Vital High Protein @ 20 ml/hr 30 ml Prostat five times per day MVI daily  Provides: 980 kcal, 117 grams protein, and 401 ml free water.  TF regimen with cleviprex and propofol at current rate providing 2266 total kcal/day. The excess in kcal is being provided by lipid.   Once appropriate pt would likely benefit from outpatient education.   NUTRITION DIAGNOSIS:   Inadequate oral intake related to inability to eat as evidenced by NPO status.  GOAL:   Provide needs based on ASPEN/SCCM guidelines  MONITOR:   TF tolerance, I & O's  REASON FOR ASSESSMENT:   Consult Enteral/tube feeding initiation and management  ASSESSMENT:   Pt with PMH of HTN, COPD, and DM who was admitted 3/24 for a stroke she received tPA and then developed an ICH.    Pt intubated following stroke and developing ICH after tPA. She is on both cleviprex and propofol at this time. Noted elevated A1C on admission   Patient is currently intubated on ventilator support MV: 7.1 L/min Temp (24hrs), Avg:99.1 F (37.3 C), Min:98 F (36.7 C), Max:100.9 F (38.3 C) MAP: 65  Propofol: 30 mcg - 16 ml/hr provides: 422 kcal per day  Cleviprex @ 9 mg/hr - 18 ml/hr provides: 864 kcal per day Medications reviewed and include: 8 units lantus BID, SSI, solumedrol Labs reviewed: CBG (last 3)  Recent Labs    08/26/18 0754 08/26/18 1134  GLUCAP 322* 228*    Lab Results  Component Value Date   HGBA1C 10.4 (H) 08/26/2018      NUTRITION - FOCUSED PHYSICAL EXAM:  Deferred   Diet Order:   Diet Order            Diet NPO time specified  Diet effective now              EDUCATION NEEDS:   No education needs have been identified at this time  Skin:  Skin Assessment: Reviewed RN Assessment  Last BM:  unknown  Height:   Ht Readings from Last 1 Encounters:  09/04/2018  5\' 3"  (1.6 m)    Weight:   Wt Readings from Last 1 Encounters:  09/04/18 88.6 kg    Ideal Body Weight:  52.2 kg  BMI:  Body mass index is 34.6 kg/m.  Estimated Nutritional Needs:   Kcal:  1000-1300  Protein:  >104 grams  Fluid:  > 1.5L/day  Kendell Bane RD, LDN, CNSC [phone] Pager [phone] After Hours Pager

## 2018-08-26 NOTE — Progress Notes (Signed)
Suctioned pt before CT trip

## 2018-08-26 NOTE — Progress Notes (Deleted)
  Echocardiogram 2D Echocardiogram has been performed.  Beth Pope 08/26/2018, 2:28 PM

## 2018-08-26 NOTE — Progress Notes (Signed)
Propofol titrated down post MRI, pt less responsive to stimulation than prior to MRI on similar dose.  Dr Roda Shutters notified, MRI results reviewed.  Will monitor.

## 2018-08-26 NOTE — Progress Notes (Signed)
PT Cancellation Note  Patient Details Name: Beth Pope MRN: [id number] DOB: [birth year]   Cancelled Treatment:    Reason Eval/Treat Not Completed: Patient not medically ready. Pt intubated due to large ICH s/p receiving tPA. Acute PT to return as able, if/when appropriate.  [name], PT, DPT Acute Rehabilitation Services Pager #: [phone] Office #: [phone]    Iona Hansen 08/26/2018, 7:41 AM

## 2018-08-26 NOTE — ED Notes (Signed)
Pt unable to follow commands for TPA 15 min. Assessment. MD aware

## 2018-08-26 NOTE — ED Provider Notes (Signed)
INTUBATION Performed by: Joya Gaskins Required items: required  , devices, and special equipment available Patient identity confirmed: provided demographic data and hospital-assigned identification number Time out: not performed due to emergent procedure Indications: altered mental status Intubation method: Glidescope Laryngoscopy  Preoxygenation: NRB  Sedatives: Etomidate Paralytic: rocuronium Tube Size:  cuffed Post-procedure assessment: chest rise and ETCO2 monitor Breath sounds: equal and absent over the epigastrium Tube secured with: ETT holder Chest x-ray interpreted by radiologist and me. Patient tolerated the procedure well with no immediate complications.    I was asked to evaluate patient due to worsening mental status.  Patient came in as a code stroke and has been seen by neurology prior to my arrival.  Patient was given TPA for an acute stroke.  She then began  to have altered mental status and repeat CT head showed intracranial hemorrhage. She had abrupt change in mental status, therefore intubation was required.  Patient was intubated without difficulty, see note above.  She will be admitted to the ICU.  Discussed the case with Dr. Laurence Slate with neurology She has been given TPA reversal     Zadie Rhine, MD 08/26/18 [phone]

## 2018-08-26 NOTE — ED Notes (Signed)
TPA 15 min. Neuro assessment. Change in pt's mental status. Pt unable to follow commands at this time.

## 2018-08-26 NOTE — Progress Notes (Signed)
eLink Physician-Brief Progress Note Patient Name: Beth Pope DOB: 1940/06/12 MRN: [id number]   Date of Service  08/26/2018  HPI/Events of Note  78 yo female presented with ALOC. Code stroke called and given tPA with complication of ICH. Now intubated and ventilated. PCCM consulted for ventilator management. VSS.  eICU Interventions  No new orders.      Intervention Category Evaluation Type: New Patient Evaluation  [name] 08/26/2018, 2:22 AM

## 2018-08-26 NOTE — Progress Notes (Signed)
Patient had elevated temp 99.8 axillary, RN gave tylenol and applied ice.  1 hour post med recheck was 100.5 axillary.  Paged Neurology and was given orders to contact CCM for temperature management.  Did so and now awaiting orders.  Will act accordingly.

## 2018-08-26 NOTE — Progress Notes (Signed)
Patient transported on vent from 4N-19 to CT and back without complications.

## 2018-08-26 NOTE — Progress Notes (Signed)
NAME:  Beth Pope, MRN:  [id number], DOB:  1940/09/03, LOS: 1 ADMISSION DATE:  08/17/2018, CONSULTATION DATE:  08/26/18 REFERRING MD:  Aroor  CHIEF COMPLAINT:  AMS   Brief History   Beth Pope is a 78 y.o. female who was admitted 3/23 with concern for CVA.  She received tPA and later had ICH.  She required intubation and was then transferred to the neuro ICU.  History of present illness   Pt is encephelopathic; therefore, this HPI is obtained from chart review. Beth Pope is a 78 y.o. female who has a PMH as outlined below (see "past medical history").  She presented to [facility] 3/24 with right sided weakness, facial droop, slurred speech.  She was last seen normal around 9:30PM.  In ED, NIH was 6 and CT was negative for hemorrhage. She was deemed to be in the window for tPA.  Following tPA, she became diaphoretic, tachypneic, and altered with complaints of headache.  She was subsequently intubated and repeat CT head demonstrated ICH and IPH.  She received 1g of TXA and was then transferred to the neuro ICU and PCCM was asked to see in consultation.  Past Medical History  HTN, COPD, DM.  Significant Hospital Events   3/23 > admit. 3/24 > intubated after ICH following tPA.  Consults:  PCCM.  Procedures:  ETT 3/24 >  L radial a line 3/24 >  Significant Diagnostic Tests:  CT head 3/23 > negative. CT head 3/24 > multifocal IPH and SAH. CT head 3/24 > worsening ICH with new IPH.  Slightly increased size of multiple other small foci of hemorrhage. MRI brain 3/24 >  Echo 3/24 >   Micro Data:  None.  Antimicrobials:  None.   Interim history/subjective:  Remains on the vent sedated FiO2 is decreasing  Objective:  Blood pressure (!) 106/30, pulse 87, temperature 99 F (37.2 C), temperature source Axillary, resp. rate 17, height '5\' 3"'  (1.6 m), weight 88.6 kg, SpO2 100 %.    Vent Mode: PRVC FiO2 (%):  [70 %-100 %] 70 % Set Rate:  [16 bmp] 16 bmp Vt Set:  [420 mL]  420 mL PEEP:  [5 cmH20] 5 cmH20 Plateau Pressure:  [18 cmH20-21 cmH20] 18 cmH20   Intake/Output Summary (Last 24 hours) at 08/26/2018 0913 Last data filed at 08/26/2018 0354 Gross per 24 hour  Intake 1049.75 ml  Output 200 ml  Net 849.75 ml   Filed Weights   08/03/2018 2200  Weight: 88.6 kg    Examination: General: 78 year old female who is sedated on full mechanical ventilatory support HEENT: Pupils equal reactive light disconjugate gaze Neuro: Right side appears to be weaker but she is sedated CV: s1s2 rrr, no m/r/g PULM: Decreased breath sounds in the bases SF:KCLE, non-tender, bsx4 active  Extremities: warm/dry, 1+ edema  Skin: no rashes or lesions   Assessment & Plan:   Concern for CVA - s/p tPA administration and later complicated by ICH / IPH.  S/p 1g TXA . Per neurology  Respiratory insufficiency - due to inability to protect the airway in the setting of above. Continue full vent support Wean FiO2 as able Spontaneous breathing trials as tolerated Pulmonary toilet Serial chest x-ray   Hypertensive emergency. Continue Cleviprex with systolic blood pressure goal less than 140 per neurology Hold p.o. antihypertensives at this time  AKI. Lab Results  Component Value Date   CREATININE 1.20 (H) 08/24/2018   CREATININE 1.24 (H) 09/01/2018   CREATININE 0.88 08/20/2017   Recent Labs  Lab 08/29/2018 2217 08/26/18 0156  K 5.0 4.1    Continue  normal saline Trend be met  Hx DM. CBG (last 3)  Recent Labs    08/26/18 0754  GLUCAP 322*    Sliding scale insulin every 4 hours 08/26/2018 add Lantus 8 units twice daily  Best Practice:  Diet: NPO.  Will need to evaluate for tube feeding Pain/Anxiety/Delirium protocol (if indicated): Propofol gtt / Fentanyl PRN.  RASS goal 0 to -1. VAP protocol (if indicated): In place. DVT prophylaxis: SCD's. GI prophylaxis: PPI. Glucose control: SSI. Mobility: Bedrest. Code Status: Full. Family Communication: 20 no family  at bedside Disposition: ICU.  Labs   CBC: Recent Labs  Lab 08/29/2018 2217 08/26/18 0156  WBC 5.9  --   NEUTROABS 2.8  --   HGB 12.0 13.9  HCT 41.2 41.0  MCV 71.9*  --   PLT 160  --    Basic Metabolic Panel: Recent Labs  Lab 08/04/2018 2217 09/02/2018 2223 08/26/18 0156  NA 135  --  138  K 5.0  --  4.1  CL 99  --   --   CO2 28  --   --   GLUCOSE 331*  --   --   BUN 8  --   --   CREATININE 1.24* 1.20*  --   CALCIUM 8.9  --   --    GFR: Estimated Creatinine Clearance: 39.5 mL/min (A) (by C-G formula based on SCr of 1.2 mg/dL (H)). Recent Labs  Lab 08/24/2018 2217  WBC 5.9   Liver Function Tests: Recent Labs  Lab 08/19/2018 2217  AST 27  ALT 13  ALKPHOS 93  BILITOT 1.0  PROT 7.0  ALBUMIN 3.8   No results for input(s): LIPASE, AMYLASE in the last 168 hours. No results for input(s): AMMONIA in the last 168 hours. ABG    Component Value Date/Time   PHART 7.404 08/26/2018 0156   PCO2ART 41.3 08/26/2018 0156   PO2ART 85.0 08/26/2018 0156   HCO3 25.8 08/26/2018 0156   TCO2 27 08/26/2018 0156   O2SAT 96.0 08/26/2018 0156    Coagulation Profile: Recent Labs  Lab 09/02/2018 2300  INR 1.0   Cardiac Enzymes: No results for input(s): CKTOTAL, CKMB, CKMBINDEX, TROPONINI in the last 168 hours. HbA1C: Hgb A1c MFr Bld  Date/Time Value Ref Range Status  08/26/2018 05:44 AM 10.4 (H) 4.8 - 5.6 % Final    Comment:    (NOTE) Pre diabetes:          5.7%-6.4% Diabetes:              >6.4% Glycemic control for   <7.0% adults with diabetes    CBG: Recent Labs  Lab 08/26/18 [place]*      Critical care time: Ref[place]io ACNP Maryanna Shape PCCM Pager [phone] till 1 pm If no answer page [phone] 08/26/2018, 9:14 AM

## 2018-08-26 NOTE — ED Notes (Signed)
Pt coming by EMS after, per pt, she was folding laundry with her daughter and noticed she felt weak on the right side. Daughter noticed pt had unsteady gait when walking to couch, per EMS. Pt has no hx of stroke. Not on blood thinners. EMS noted slurred speech upon arrival and worsening during route to hospital. Pt has right droop,slurred speech, and right side weakness currently.

## 2018-08-26 NOTE — Progress Notes (Signed)
SLP Cancellation Note  Patient Details Name: Beth Pope MRN: [id number] DOB: [birth year]   Cancelled treatment:       Reason Eval/Treat Not Completed: Patient not medically ready. Will follow for readiness.    Saniyya Gau, Riley Nearing 08/26/2018, 7:42 AM

## 2018-08-26 NOTE — Consult Note (Signed)
NAME:  Beth Pope, MRN:  [id number], DOB:  [birth year], LOS: 1 ADMISSION DATE:  09-02-18, CONSULTATION DATE:  08/26/18 REFERRING MD:  Aroor  CHIEF COMPLAINT:  AMS   Brief History   Beth Pope is a 78 y.o. female who was admitted 3/23 with concern for CVA.  She received tPA and later had ICH.  She required intubation and was then transferred to the neuro ICU.  History of present illness   Pt is encephelopathic; therefore, this HPI is obtained from chart review. Beth Pope is a 78 y.o. female who has a PMH as outlined below (see "past medical history").  She presented to [facility] 3/24 with right sided weakness, facial droop, slurred speech.  She was last seen normal around 9:30PM.  In ED, NIH was 6 and CT was negative for hemorrhage. She was deemed to be in the window for tPA.  Following tPA, she became diaphoretic, tachypneic, and altered with complaints of headache.  She was subsequently intubated and repeat CT head demonstrated ICH and IPH.  She received 1g of TXA and was then transferred to the neuro ICU and PCCM was asked to see in consultation.  Past Medical History  HTN, COPD, DM.  Significant Hospital Events   3/23 > admit. 3/24 > intubated after ICH following tPA.  Consults:  PCCM.  Procedures:  ETT 3/24 >  L radial a line 3/24 >  Significant Diagnostic Tests:  CT head 3/23 > negative. CT head 3/24 > multifocal IPH and SAH. CT head 3/24 > worsening ICH with new IPH.  Slightly increased size of multiple other small foci of hemorrhage. MRI brain 3/24 >  Echo 3/24 >   Micro Data:  None.  Antimicrobials:  None.   Interim history/subjective:  On vent, sedated.  Objective:  Blood pressure 135/69, pulse 81, temperature 98.4 F (36.9 C), temperature source Axillary, resp. rate 18, height 5\' 3"  (1.6 m), weight 88.6 kg, SpO2 100 %.    Vent Mode: PRVC FiO2 (%):  [100 %] 100 % Set Rate:  [16 bmp] 16 bmp Vt Set:  [420 mL] 420 mL PEEP:  [5 cmH20] 5 cmH20  Plateau Pressure:  [21 cmH20] 21 cmH20   Intake/Output Summary (Last 24 hours) at 08/26/2018 0236 Last data filed at 08/26/2018 0145 Gross per 24 hour  Intake 30.1 ml  Output -  Net 30.1 ml   Filed Weights   09-02-2018 2200  Weight: 88.6 kg    Examination: General: Adult female, in NAD. Neuro: Sedated, does not follow commands. HEENT: [place]/AT. Sclerae anicteric.  ETT in place. Cardiovascular: RRR, no M/R/G.  Lungs: Respirations even and unlabored.  Coarse bilaterally. Abdomen: BS x 4, soft, NT/ND.  Musculoskeletal: No gross deformities, no edema.  Skin: Intact, warm, no rashes.  Assessment & Plan:   Concern for CVA - s/p tPA administration and later complicated by ICH / IPH.  S/p 1g TXA . - Neuro following / managing. - Further imaging per neuro.  Respiratory insufficiency - due to inability to protect the airway in the setting of above. - Full vent support. - Assess ABG. - Wean as able. - Daily SBT. - Bronchial hygiene. - Follow CXR.  Hypertensive emergency. - Continue cleviprex, goal SBP < 140 per neuro. - Hold preadmission amlodipine.  AKI. - NS @ 75. - Follow BMP.  Hx DM. - SSI. - Hold preadmission humalog.  Best Practice:  Diet: NPO. Pain/Anxiety/Delirium protocol (if indicated): Propofol gtt / Fentanyl PRN.  RASS goal 0 to -1. VAP  protocol (if indicated): In place. DVT prophylaxis: SCD's. GI prophylaxis: PPI. Glucose control: SSI. Mobility: Bedrest. Code Status: Full. Family Communication: None available. Disposition: ICU.  Labs   CBC: Recent Labs  Lab 2018/09/01 2217 08/26/18 0156  WBC 5.9  --   NEUTROABS 2.8  --   HGB 12.0 13.9  HCT 41.2 41.0  MCV 71.9*  --   PLT 160  --    Basic Metabolic Panel: Recent Labs  Lab 2018-09-01 2217 2018/09/01 2223 08/26/18 0156  NA 135  --  138  K 5.0  --  4.1  CL 99  --   --   CO2 28  --   --   GLUCOSE 331*  --   --   BUN 8  --   --   CREATININE 1.24* 1.20*  --   CALCIUM 8.9  --   --    GFR:  Estimated Creatinine Clearance: 39.5 mL/min (A) (by C-G formula based on SCr of 1.2 mg/dL (H)). Recent Labs  Lab 09-01-2018 2217  WBC 5.9   Liver Function Tests: Recent Labs  Lab 2018/09/01 2217  AST 27  ALT 13  ALKPHOS 93  BILITOT 1.0  PROT 7.0  ALBUMIN 3.8   No results for input(s): LIPASE, AMYLASE in the last 168 hours. No results for input(s): AMMONIA in the last 168 hours. ABG    Component Value Date/Time   PHART 7.404 08/26/2018 0156   PCO2ART 41.3 08/26/2018 0156   PO2ART 85.0 08/26/2018 0156   HCO3 25.8 08/26/2018 0156   TCO2 27 08/26/2018 0156   O2SAT 96.0 08/26/2018 0156    Coagulation Profile: Recent Labs  Lab 2018-09-01 2300  INR 1.0   Cardiac Enzymes: No results for input(s): CKTOTAL, CKMB, CKMBINDEX, TROPONINI in the last 168 hours. HbA1C: No results found for: HGBA1C CBG: No results for input(s): GLUCAP in the last 168 hours.  Review of Systems:   Unable to obtain as pt is encephalopathic.  Past medical history  She,  has a past medical history of Arthritis, COPD (chronic obstructive pulmonary disease) (HCC), Diabetes mellitus without complication (HCC), and Hypertension.   Surgical History    Past Surgical History:  Procedure Laterality Date  . ABDOMINAL HYSTERECTOMY    . BREAST SURGERY    . COLON SURGERY       Social History   reports that she has never smoked. She has never used smokeless tobacco. She reports that she does not drink alcohol or use drugs.   Family history   Her family history is not on file.   Allergies Allergies  Allergen Reactions  . Penicillins Hives  . Sulfa Antibiotics Swelling  . Trulicity [Dulaglutide] Hives     Home meds  Prior to Admission medications   Medication Sig Start Date End Date Taking? Authorizing Provider  albuterol (PROVENTIL HFA;VENTOLIN HFA) 108 (90 Base) MCG/ACT inhaler Inhale into the lungs every 6 (six) hours as needed for wheezing or shortness of breath.    [provider]   amLODipine (NORVASC) 5 MG tablet Take 5 mg by mouth daily.    [provider]  insulin lispro protamine-lispro (HUMALOG 75/25 MIX) (75-25) 100 UNIT/ML SUSP injection Inject into the skin.    [provider]    Critical care time: 47.    Rutherford Guys, PA - [facility] Pager: [phone].  If no answer, [phone] 08/26/2018, 2:36 AM

## 2018-08-26 NOTE — Progress Notes (Signed)
STROKE TEAM PROGRESS NOTE   INTERVAL HISTORY RN at bedside.  Patient still intubated on sedation.  Eyes closed not following commands.  Withdrawal bilateral upper extremities on pain.  No movement at right lower extremity.  Pending MRI/MRA.  Vitals:   08/26/18 0700 08/26/18 0715 08/26/18 0730 08/26/18 0739  BP: (!) 117/51 (!) 120/46 (!) 123/48   Pulse: 80 80 81   Resp: 16 16 16    Temp:      TempSrc:      SpO2: 100% 100% 100% 100%  Weight:      Height:        CBC:  Recent Labs  Lab 12-Sep-2018 2217 08/26/18 0156  WBC 5.9  --   NEUTROABS 2.8  --   HGB 12.0 13.9  HCT 41.2 41.0  MCV 71.9*  --   PLT 160  --     Basic Metabolic Panel:  Recent Labs  Lab Sep 12, 2018 2217 09-12-2018 2223 08/26/18 0156  NA 135  --  138  K 5.0  --  4.1  CL 99  --   --   CO2 28  --   --   GLUCOSE 331*  --   --   BUN 8  --   --   CREATININE 1.24* 1.20*  --   CALCIUM 8.9  --   --    Lipid Panel:     Component Value Date/Time   CHOL 178 08/26/2018 0500   TRIG 191 (H) 08/26/2018 0500   HDL 38 (L) 08/26/2018 0500   CHOLHDL 4.7 08/26/2018 0500   VLDL 38 08/26/2018 0500   LDLCALC 102 (H) 08/26/2018 0500   HgbA1c:  Lab Results  Component Value Date   HGBA1C 10.4 (H) 08/26/2018   Urine Drug Screen: No results found for: LABOPIA, COCAINSCRNUR, LABBENZ, AMPHETMU, THCU, LABBARB  Alcohol Level     Component Value Date/Time   ETH <10 Sep 12, 2018 2225    IMAGING Ct Head Wo Contrast  Result Date: 08/26/2018 CLINICAL DATA:  Intracranial hemorrhage follow up, status post tPA EXAM: CT HEAD WITHOUT CONTRAST TECHNIQUE: Contiguous axial images were obtained from the base of the skull through the vertex without intravenous contrast. COMPARISON:  08/26/2018 FINDINGS: Brain: Intracranial hemorrhage has worsened since the prior scan. There is a new focus of intraparenchymal hemorrhage in the right temporal lobe that measures 3.3 x 1.5 x 1.6 cm (volume = 4.1 cm^3). Bilateral occipital lobe hemorrhages are  slightly larger. Distribution of subarachnoid blood is unchanged. No midline shift or other mass effect. Vascular: No abnormal hyperdensity of the major intracranial arteries or dural venous sinuses. No intracranial atherosclerosis. Skull: The visualized skull base, calvarium and extracranial soft tissues are normal. Sinuses/Orbits: No fluid levels or advanced mucosal thickening of the visualized paranasal sinuses. No mastoid or middle ear effusion. The orbits are normal. IMPRESSION: 1. Worsening intracranial hemorrhage, most notably within the right temporal lobe, where there is a new intraparenchymal hematoma that measures 4.1 mL. 2. Slightly increased size of multiple other small foci of hemorrhage with unchanged distribution of subarachnoid blood. 3. No midline shift or other mass effect. 4. Critical Value/emergent results were called by telephone at the time of interpretation on 08/26/2018 at 1:54 am to Dr. Georgiana Spinner Aroor, who verbally acknowledged these results. Electronically Signed   By: Deatra Robinson M.D.   On: 08/26/2018 01:55   Ct Head Wo Contrast  Addendum Date: 08/26/2018   ADDENDUM REPORT: 08/26/2018 00:37 ADDENDUM: Critical Value/emergent results were called by telephone at the time of  interpretation on 08/26/2018 at 12:36 am to Dr. Georgiana SpinnerSushanth Aroor , who verbally acknowledged these results. Electronically Signed   By: Deatra RobinsonKevin  Herman M.D.   On: 08/26/2018 00:37   Result Date: 08/26/2018 CLINICAL DATA:  Status post tPA EXAM: CT HEAD WITHOUT CONTRAST TECHNIQUE: Contiguous axial images were obtained from the base of the skull through the vertex without intravenous contrast. COMPARISON:  None. FINDINGS: Brain: There is multifocal subarachnoid and parenchymal hemorrhage within both hemispheres. Largest area hemorrhages in the left frontal lobe, measuring approximately 1.6 x 1.0 x 1.6 cm (volume = 1.3 cm^3). Smaller foci of hemorrhage are located within both occipital lobes. The subarachnoid blood is  greatest over the right parietal convexity. No midline shift or other mass effect. There is periventricular hypoattenuation compatible with chronic microvascular disease. Vascular: No abnormal hyperdensity of the major intracranial arteries or dural venous sinuses. No intracranial atherosclerosis. Skull: The visualized skull base, calvarium and extracranial soft tissues are normal. Sinuses/Orbits: No fluid levels or advanced mucosal thickening of the visualized paranasal sinuses. No mastoid or middle ear effusion. The orbits are normal. IMPRESSION: Multifocal acute intraparenchymal and subarachnoid hemorrhage involving both hemispheres, greatest in the left frontal lobe. No associated mass effect. Electronically Signed: By: Deatra RobinsonKevin  Herman M.D. On: 08/26/2018 00:28   Dg Chest Port 1 View  Result Date: 08/26/2018 CLINICAL DATA:  Intubation. EXAM: PORTABLE CHEST 1 VIEW COMPARISON:  02/14/2018 FINDINGS: Endotracheal tube tip slightly low in positioning 16 mm from the carina. Tip and side port of the enteric tube below the diaphragm in the stomach. Low lung volumes. Unchanged heart size and mediastinal contours with aortic tortuosity. Bibasilar atelectasis. No evidence of pulmonary edema, large pleural effusion or pneumothorax. Scattered peripheral fibrosis. IMPRESSION: 1. Endotracheal tube tip slightly low in positioning 16 mm from the carina, consider retraction of 1-2 cm. Enteric tube in place. 2. Low lung volumes with bibasilar atelectasis. Electronically Signed   By: Narda RutherfordMelanie  Sanford M.D.   On: 08/26/2018 00:50   Ct Head Code Stroke Wo Contrast  Result Date: 08/11/2018 CLINICAL DATA:  Code stroke. Right-sided weakness. Last seen normal 21:30. EXAM: CT HEAD WITHOUT CONTRAST TECHNIQUE: Contiguous axial images were obtained from the base of the skull through the vertex without intravenous contrast. COMPARISON:  02/10/2018 FINDINGS: Brain: There is no mass, hemorrhage or extra-axial collection. The size and  configuration of the ventricles and extra-axial CSF spaces are normal. There is hypoattenuation of the periventricular white matter, most commonly indicating chronic ischemic microangiopathy. Vascular: No abnormal hyperdensity of the major intracranial arteries or dural venous sinuses. No intracranial atherosclerosis. Skull: The visualized skull base, calvarium and extracranial soft tissues are normal. Sinuses/Orbits: No fluid levels or advanced mucosal thickening of the visualized paranasal sinuses. No mastoid or middle ear effusion. The orbits are normal. ASPECTS [facility]([place] Stroke Program Early CT Score) - Ganglionic level infarction (caudate, lentiform nuclei, internal capsule, insula, M1-M3 cortex): 7 - Supraganglionic infarction (M4-M6 cortex): 3 Total score (0-10 with 10 being normal): 10 IMPRESSION: 1. No acute intracranial abnormality. 2. ASPECTS is 10. * These results were communicated to Dr. Georgiana SpinnerSushanth Aroor at 10:31 pm on 08/30/2018 by text page via the [facility] messaging system. Electronically Signed   By: Deatra RobinsonKevin  Herman M.D.   On: 08/18/2018 22:32   MRI and MRA pending  PHYSICAL EXAM  Temp:  [98 F (36.7 C)-99 F (37.2 C)] 99 F (37.2 C) (03/24 0800) Pulse Rate:  [71-96] 91 (03/24 1000) Resp:  [14-24] 18 (03/24 1000) BP: (100-239)/(30-120) 140/50 (03/24 1000) SpO2:  [  96 %-100 %] 100 % (03/24 1000) Arterial Line BP: (103-144)/(37-51) 144/47 (03/24 1000) FiO2 (%):  [70 %-100 %] 70 % (03/24 0739) Weight:  [88.6 kg] 88.6 kg (03/23 2200)  General - Well nourished, well developed, intubated on sedation.  Ophthalmologic - fundi not visualized due to noncooperation.  Cardiovascular - Regular rate and rhythm.  Neuro - intubated on sedation, eyes closed, not following commands. With forced eye opening, eyes in mid position, not blinking to visual threat, doll's eyes diminished, not tracking, PERRL. Corneal reflex absent bilaterally, gag and cough present. Breathing over the vent.  Facial symmetry  not able to test due to ET tube.  Tongue midline in mouth. On pain stimulation, 2+/5 BUEs and 2/5 LLE but 0/5 RLE. DTR diminished and no babinski. Sensation, coordination and gait not tested.   ASSESSMENT/PLAN Ms. Beverly Rhoades is a 78 y.o. female with history of DB, HTN, COPD presenting with R sided weakness, facial droop and slurred speech. BP 230 en route. CT neg. Received tPA 09-02-2018 at 2238. HA, mental status change and fluctuating BP post tPA. CT showed SAH and ICH. Reversed w/ TXA.   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 ASPECTS 10.   CT head 3/24 0006 multifocal B IPH and SAH. No mass effect  CT head  3/24 0144 worsening ICH w/ new R temporal hmg 27mL. slightly increased size of other small ICH with unchanged SAH. No shift.  MRI  Pending   MRA  Pending   Carotid Doppler  Pending   2D Echo  Pending   LDL 102  HgbA1c 10.4  Fibrinogen 336 s/p TXA  SCDs for VTE prophylaxis  No antithrombotic prior to admission, now on No antithrombotic given post tPA hemorrhage   Started on Keppra 500 q 12h for seizure prophylaxis - will continue for total 7 days  Therapy recommendations:  pending   Disposition:  pending   Hypertensive Emergency  BP 230 per EMS  Lowered w. labetalol for tPA administration   Required cleviprex post tPA for fluctuating BP control  SBP goal < 140  Home meds: norvasc 5  Acute Reparatory Failure  Unable to protect airway following ICH after tPA  Intubated in ED  On propofol   CCM on board  Dysaphgia  Secondary to stroke  NPO  Hyperlipidemia  Home meds:  No statin  LDL 102, goal < 70  Hold statin given hemorrhage  Consider statin at discharge based on plan of care  Diabetes type II  HgbA1c 10.4, goal <  7.0  Uncontrolled  CBGs   SSI  CCM on board  Other Stroke Risk Factors  Advanced age  Obesity, Body mass index is 34.6 kg/m., recommend weight loss, diet and exercise as appropriate   Other Active Problems  AKI, Cr 1.2  COPD  Hospital day # 1  This patient is critically ill due to stroke, hemorrhage s/p tPA, hyperglycemia, hypertension, respiratory failure and at significant risk of neurological worsening, death form hematoma expansion, recurrent stroke, cerebral edema, seizure, brain herniation. This patient's care requires constant monitoring of vital signs, hemodynamics, respiratory and cardiac monitoring, review of multiple databases, neurological assessment, discussion with family, other specialists and medical decision making of high complexity. I spent 40 minutes of neurocritical care time in the care of this patient.  Marvel Plan, MD PhD Stroke Neurology 08/26/2018 10:15 AM   To contact Stroke Continuity provider, please refer to [email or web address]. After hours, contact General Neurology

## 2018-08-26 NOTE — ED Notes (Signed)
Vitals from [phone] unavailable due to BP portion of monitor not working. Medication for BP and Dinamap obtained in order to get pt's BP accurately

## 2018-08-26 NOTE — Progress Notes (Signed)
Patient transported from CT to 4N19 with no complications.

## 2018-08-26 NOTE — Progress Notes (Signed)
Pt transported to CT without complications  

## 2018-08-26 NOTE — ED Notes (Signed)
Vitals from [phone] unavailable due to patient being moved from CT and into room.

## 2018-08-26 NOTE — ED Notes (Addendum)
Due to worsening in pt's condition and increase in confusion it was decided that pt would be intubated. Pt sedated and intubated @ 0023. OG placed at 0024

## 2018-08-26 NOTE — H&P (Addendum)
Chief Complaint: Right-sided weakness, slurred speech  History obtained from: Patient and Chart    HPI:                                                                                                                                       Beth BickersFlorence Pope is an 78 y.o. female past medical history of diabetes mellitus, COPD, hypertension presents to the emergency room as a code stroke for sudden onset right-sided weakness, facial droop and slurred speech.  Last known normal was 9:30 PM.  Patient was her normal self and talking when she suddenly developed slurred speech and felt weak on the right side.  According to EMS blood pressure was 230 systolic in route.   On arrival patient had a NIH stroke scale of 6 and stat head CT was negative for hemorrhage.  Discussed with patient risk versus benefit of IV TPA as well as with daughter over the phone and TPA was administered. Patient received 10 mg of IV labetalol prior to CT head repeat blood pressure was 128/100 just prior to receiving IV TPA.  Blood pressure was repeated on the left arm and was 190 systolic and patient received an additional 10 mg of IV labetalol and blood pressure improved to below 180/110 mmHg.  Ed Course:  Reassessed the patient around 3511 PM.  Patient strength in right upper and lower extremity had improved.  Blood pressure was 178/ 77 mmHg at the time and remained below 180/105 mmHg. Nurse was notified to call if blood pressure greater than 180.   At 11:14 PM received a call stating the blood pressure was 197/71 mmHg. Asked nurse to give 10 mg more of labetalol and ordered Cleviprex to keep blood pressure below 180 systolic.  Around this time, however patient became diaphoretic and increased respirations per nurse and ED PA was notified.  Also blood pressure monitor was not picking up readings per nurse and this time she needed to get another machine.  I was called by patient's nurse again at 11:46 PM stating the above and  declining mental status.  Arrived immediately and asked if patient was having headache to which she said yes.  tPA already completed at the time.  Additional labetalol was administered and patient was immediately taken to CT scanner which showed patient had multiple focal subarachnoid hemorrhages without midline shift.  Was emergently notified to mix TXA and this was administered at 12:18 AM.  Blood pressure was attempted to be recorded but difficult as patient was moving.  Patient intubated for airway protection as she was becoming very agitated and confused.  Also continued to have wide fluctuation in readings varying from 100 systolic to 239 systolic. After receiving multiple doses of labetalol and starting her on Cardene drip blood pressure was brought down below 140 systolic. Arterial line was placed to get more accurate blood pressure readings.   Date last  known well: 09-16-18 Time last known well: 9:30 PM tPA Given: yes NIHSS: 6 Baseline MRS 0   Past Medical History:  Diagnosis Date  . Arthritis   . COPD (chronic obstructive pulmonary disease) (HCC)   . Diabetes mellitus without complication (HCC)   . Hypertension     Past Surgical History:  Procedure Laterality Date  . ABDOMINAL HYSTERECTOMY    . BREAST SURGERY    . COLON SURGERY      No family history on file. Social History:  reports that she has never smoked. She has never used smokeless tobacco. She reports that she does not drink alcohol or use drugs.  Allergies:  Allergies  Allergen Reactions  . Penicillins Hives  . Sulfa Antibiotics Swelling  . Trulicity [Dulaglutide] Hives    Medications:                                                                                                                        I reviewed home medications   ROS:                                                                                                                                     14 systems reviewed and negative except  above    Examination:                                                                                                      General: Appears well-developed  Psych: Affect appropriate to situation Eyes: No scleral injection HENT: No OP obstrucion Head: Normocephalic.  Cardiovascular: Normal rate and regular rhythm.  Respiratory: Effort normal and breath sounds normal to anterior ascultation GI: Soft.  No distension. There is no tenderness.  Skin: WDI    Neurological Examination Mental Status: Alert, oriented, thought content appropriate.  Speech fluent without evidence of aphasia. Able to follow 3 step commands without difficulty. Cranial Nerves: II: Visual fields grossly normal,  III,IV, VI: ptosis not present, extra-ocular motions intact  bilaterally, pupils equal, round, reactive to light and accommodation V,VII: smile symmetric, facial light touch sensation normal bilaterally VIII: hearing normal bilaterally IX,X: uvula rises symmetrically XI: bilateral shoulder shrug XII: midline tongue extension Motor: Right : Upper extremity   5/5    Left:     Upper extremity   5/5  Lower extremity   5/5     Lower extremity   5/5 Tone and bulk:normal tone throughout; no atrophy noted Sensory: Pinprick and light touch intact throughout, bilaterally Deep Tendon Reflexes: 2+ and symmetric throughout Plantars: Right: downgoing   Left: downgoing Cerebellar: normal finger-to-nose, normal rapid alternating movements and normal heel-to-shin test Gait: normal gait and station     Lab Results: Basic Metabolic Panel: Recent Labs  Lab 08-28-18 2217 28-Aug-2018 2223  NA 135  --   K 5.0  --   CL 99  --   CO2 28  --   GLUCOSE 331*  --   BUN 8  --   CREATININE 1.24* 1.20*  CALCIUM 8.9  --     CBC: Recent Labs  Lab Aug 28, 2018 2217  WBC 5.9  NEUTROABS 2.8  HGB 12.0  HCT 41.2  MCV 71.9*  PLT 160    Coagulation Studies: Recent Labs    August 28, 2018 2300  LABPROT 13.3  INR 1.0     Imaging: Ct Head Wo Contrast  Result Date: 08/26/2018 CLINICAL DATA:  Status post tPA EXAM: CT HEAD WITHOUT CONTRAST TECHNIQUE: Contiguous axial images were obtained from the base of the skull through the vertex without intravenous contrast. COMPARISON:  None. FINDINGS: Brain: There is multifocal subarachnoid and parenchymal hemorrhage within both hemispheres. Largest area hemorrhages in the left frontal lobe, measuring approximately 1.6 x 1.0 x 1.6 cm (volume = 1.3 cm^3). Smaller foci of hemorrhage are located within both occipital lobes. The subarachnoid blood is greatest over the right parietal convexity. No midline shift or other mass effect. There is periventricular hypoattenuation compatible with chronic microvascular disease. Vascular: No abnormal hyperdensity of the major intracranial arteries or dural venous sinuses. No intracranial atherosclerosis. Skull: The visualized skull base, calvarium and extracranial soft tissues are normal. Sinuses/Orbits: No fluid levels or advanced mucosal thickening of the visualized paranasal sinuses. No mastoid or middle ear effusion. The orbits are normal. IMPRESSION: Multifocal acute intraparenchymal and subarachnoid hemorrhage involving both hemispheres, greatest in the left frontal lobe. No associated mass effect. Electronically Signed   By: Deatra Robinson M.D.   On: 08/26/2018 00:28   Ct Head Code Stroke Wo Contrast  Result Date: 08/28/2018 CLINICAL DATA:  Code stroke. Right-sided weakness. Last seen normal 21:30. EXAM: CT HEAD WITHOUT CONTRAST TECHNIQUE: Contiguous axial images were obtained from the base of the skull through the vertex without intravenous contrast. COMPARISON:  02/10/2018 FINDINGS: Brain: There is no mass, hemorrhage or extra-axial collection. The size and configuration of the ventricles and extra-axial CSF spaces are normal. There is hypoattenuation of the periventricular white matter, most commonly indicating chronic ischemic  microangiopathy. Vascular: No abnormal hyperdensity of the major intracranial arteries or dural venous sinuses. No intracranial atherosclerosis. Skull: The visualized skull base, calvarium and extracranial soft tissues are normal. Sinuses/Orbits: No fluid levels or advanced mucosal thickening of the visualized paranasal sinuses. No mastoid or middle ear effusion. The orbits are normal. ASPECTS [facility] Stroke Program Early CT Score) - Ganglionic level infarction (caudate, lentiform nuclei, internal capsule, insula, M1-M3 cortex): 7 - Supraganglionic infarction (M4-M6 cortex): 3 Total score (0-10 with 10 being normal): 10 IMPRESSION: 1. No acute intracranial  abnormality. 2. ASPECTS is 10. * These results were communicated to Dr. Georgiana Spinner Iniya Matzek at 10:31 pm on 08/17/2018 by text page via the [facility] messaging system. Electronically Signed   By: Deatra Robinson M.D.   On: 08/30/2018 22:32     ASSESSMENT AND PLAN   78 y.o. female past medical history of diabetes mellitus, COPD, hypertension presents to the emergency room as a code stroke for sudden onset right-sided weakness, facial droop and slurred speech.  NIH stroke scale was 6, with disabling symptoms most notably right leg hemiparesis.  CT head was negative for hemorrhage.  Patient received IV TPA.  She had fluctuating blood pressures varying from > 220 systolic prior to arrival down to 140 systolic in her left arm, 107 systolic to 128 systolic in her right arm.  After completion of TPA, she complained of headache, with became more confused and started becoming diaphoretic.  Stat CT head showed multifocal subarachnoid and intracranial hemorrhages-was reversed immediately with tranexamic acid.  Patient was intubated for airway protection.  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   Left side acute ischemic stroke s/p tPA with Intracranial hemorrhage  Multifocal parenchymal subarachnoid  hemorrhage and parenchymal hemorrhages post TPA  reversed with TXA   Plan BP goal less than 140/90 mmHG, follow BP readings from arterial line Repeat CT head: Showed new right temporal hemorrhage and increased density in size of prior hemorrhages.  No midline shift noted. Fibrinogen level ordered: Level was 336 so no need for cryoprecipitate. Hold antiplatelets/anticoagulation Repeat CT head in 24 hours Echocardiogram Check A1c and lipid profile MRI brain without contrast when patient stable MRA of the head and carotid Doppler Keppra 500 mg for seizure prophylaxis given subarachnoid hemorrhage  Acute respiratory failure secondary to neurological decline -Appreciate ED for intubation and PCCM for ventilation management  Hypertensive emergency -Currently on nicardipine drip,  -Arterial line placed  Diabetes A1c levels pending Sliding scale insulin ordered  Full code DVT prophylaxis SCDs   This patient is neurologically critically ill due to stroke s/p tPA with SICH. He is at risk for significant risk of neurological worsening from cerebral edema,  death from brain herniation, heart failure, hemorrhagic conversion, infection, respiratory failure and seizure. This patient's care requires constant monitoring of vital signs, hemodynamics, respiratory and cardiac monitoring, review of multiple databases, neurological assessment, discussion with family, other specialists and medical decision making of high complexity.  I spent  120 minutes of neurocritical time in the care of this patient.      Enyla Lisbon [facility] Pager Number [phone]

## 2018-08-26 NOTE — ED Notes (Signed)
TPA 15 minute assessment not completed due to pt's status change and requiring intubation

## 2018-08-26 NOTE — Progress Notes (Signed)
  Echocardiogram 2D Echocardiogram has been performed.  Duvall Comes L Androw 08/26/2018, 4:01 PM

## 2018-08-27 ENCOUNTER — Inpatient Hospital Stay (HOSPITAL_COMMUNITY): Payer: Medicare Other

## 2018-08-27 DIAGNOSIS — J9601 Acute respiratory failure with hypoxia: Secondary | ICD-10-CM

## 2018-08-27 LAB — POCT I-STAT 7, (LYTES, BLD GAS, ICA,H+H)
ACID-BASE EXCESS: 1 mmol/L (ref 0.0–2.0)
Bicarbonate: 26.3 mmol/L (ref 20.0–28.0)
Calcium, Ion: 1.28 mmol/L (ref 1.15–1.40)
HCT: 33 % — ABNORMAL LOW (ref 36.0–46.0)
Hemoglobin: 11.2 g/dL — ABNORMAL LOW (ref 12.0–15.0)
O2 Saturation: 100 %
Potassium: 3.3 mmol/L — ABNORMAL LOW (ref 3.5–5.1)
Sodium: 139 mmol/L (ref 135–145)
TCO2: 28 mmol/L (ref 22–32)
pCO2 arterial: 44.1 mmHg (ref 32.0–48.0)
pH, Arterial: 7.384 (ref 7.350–7.450)
pO2, Arterial: 188 mmHg — ABNORMAL HIGH (ref 83.0–108.0)

## 2018-08-27 LAB — GLUCOSE, CAPILLARY
GLUCOSE-CAPILLARY: 230 mg/dL — AB (ref 70–99)
Glucose-Capillary: 148 mg/dL — ABNORMAL HIGH (ref 70–99)
Glucose-Capillary: 183 mg/dL — ABNORMAL HIGH (ref 70–99)
Glucose-Capillary: 257 mg/dL — ABNORMAL HIGH (ref 70–99)
Glucose-Capillary: 254 mg/dL — ABNORMAL HIGH (ref 70–99)
Glucose-Capillary: 118 mg/dL — ABNORMAL HIGH (ref 70–99)

## 2018-08-27 LAB — BASIC METABOLIC PANEL
Anion gap: 8 (ref 5–15)
BUN: 13 mg/dL (ref 8–23)
CHLORIDE: 106 mmol/L (ref 98–111)
CO2: 24 mmol/L (ref 22–32)
Calcium: 8.5 mg/dL — ABNORMAL LOW (ref 8.9–10.3)
Creatinine, Ser: 0.75 mg/dL (ref 0.44–1.00)
GFR calc Af Amer: >60 mL/min (ref >60)
GFR calc non Af Amer: >60 mL/min (ref >60)
Glucose, Bld: 171 mg/dL — ABNORMAL HIGH (ref 70–99)
Potassium: 3.2 mmol/L — ABNORMAL LOW (ref 3.5–5.1)
Sodium: 138 mmol/L (ref 135–145)

## 2018-08-27 LAB — MAGNESIUM
Magnesium: 1.8 mg/dL (ref 1.7–2.4)
Magnesium: 1.8 mg/dL (ref 1.7–2.4)

## 2018-08-27 LAB — PHOSPHORUS
Phosphorus: 3.5 mg/dL (ref 2.5–4.6)
Phosphorus: 3 mg/dL (ref 2.5–4.6)

## 2018-08-27 LAB — CBC
HCT: 32.7 % — ABNORMAL LOW (ref 36.0–46.0)
HEMOGLOBIN: 10 g/dL — AB (ref 12.0–15.0)
MCH: 21.8 pg — AB (ref 26.0–34.0)
MCHC: 30.6 g/dL (ref 30.0–36.0)
MCV: 71.2 fL — ABNORMAL LOW (ref 80.0–100.0)
Platelets: 137 10*3/uL — ABNORMAL LOW (ref 150–400)
RBC: 4.59 MIL/uL (ref 3.87–5.11)
RDW: 14.6 % (ref 11.5–15.5)
WBC: 11.7 10*3/uL — ABNORMAL HIGH (ref 4.0–10.5)
nRBC: 0 % (ref 0.0–0.2)

## 2018-08-27 MED ORDER — AMLODIPINE BESYLATE 10 MG PO TABS
10.0000 mg | ORAL_TABLET | Freq: Every day | ORAL | Status: DC
  Administered 2018-08-27 – 2018-08-28 (×2): 10 mg via ORAL
  Filled 2018-08-27 (×2): qty 1

## 2018-08-27 MED ORDER — LISINOPRIL 5 MG PO TABS: 5.0000 mg | ORAL_TABLET | Freq: Every day | ORAL | Status: DC

## 2018-08-27 MED ORDER — CARVEDILOL 3.125 MG PO TABS
6.2500 mg | ORAL_TABLET | Freq: Two times a day (BID) | ORAL | Status: DC
  Administered 2018-08-27 – 2018-08-28 (×4): 6.25 mg via ORAL
  Filled 2018-08-27 (×4): qty 2

## 2018-08-27 MED ORDER — INSULIN GLARGINE 100 UNIT/ML ~~LOC~~ SOLN
10.0000 [IU] | Freq: Two times a day (BID) | SUBCUTANEOUS | Status: DC
  Administered 2018-08-27 – 2018-08-28 (×2): 10 [IU] via SUBCUTANEOUS
  Filled 2018-08-27 (×2): qty 0.1

## 2018-08-27 MED ORDER — CARVEDILOL 3.125 MG PO TABS: 3.1250 mg | ORAL_TABLET | Freq: Two times a day (BID) | ORAL | Status: DC

## 2018-08-27 MED ORDER — LEVETIRACETAM IN NACL 500 MG/100ML IV SOLN
500.0000 mg | Freq: Two times a day (BID) | INTRAVENOUS | Status: AC
  Administered 2018-08-27 – 2018-09-01 (×12): 500 mg via INTRAVENOUS
  Filled 2018-08-27 (×11): qty 100

## 2018-08-27 MED ORDER — INSULIN GLARGINE 100 UNIT/ML ~~LOC~~ SOLN
2.0000 [IU] | Freq: Once | SUBCUTANEOUS | Status: AC
  Administered 2018-08-27: 2 [IU] via SUBCUTANEOUS
  Filled 2018-08-27: qty 0.02

## 2018-08-27 MED ORDER — HYDRALAZINE HCL 20 MG/ML IJ SOLN
10.0000 mg | INTRAMUSCULAR | Status: DC | PRN
  Administered 2018-08-27 – 2018-08-28 (×2): 20 mg via INTRAVENOUS
  Filled 2018-08-27: qty 2

## 2018-08-27 MED ORDER — POTASSIUM CHLORIDE 20 MEQ/15ML (10%) PO SOLN
40.0000 meq | Freq: Once | ORAL | Status: AC
  Administered 2018-08-27: 40 meq
  Filled 2018-08-27: qty 30

## 2018-08-27 MED ORDER — LISINOPRIL 20 MG PO TABS
20.0000 mg | ORAL_TABLET | Freq: Every day | ORAL | Status: DC
  Administered 2018-08-27 – 2018-08-28 (×2): 20 mg via ORAL
  Filled 2018-08-27 (×2): qty 1

## 2018-08-27 NOTE — Progress Notes (Signed)
SLP Cancellation Note  Patient Details Name: [name] MRN: [id number] DOB: 12-24-1940   Cancelled treatment:       Reason Eval/Treat Not Completed: Medical issues which prohibited therapy;Patient not medically ready. Pt remains intubated at this time. SLP will continue to follow pt.   Vira Chaplin I. Vear Clock, MS, CCC-SLP Acute Rehabilitation Services Office number [phone] Pager [phone]  Scheryl Marten 08/27/2018, 10:33 AM

## 2018-08-27 NOTE — Progress Notes (Signed)
Inpatient Diabetes Program Recommendations  AACE/ADA: New Consensus Statement on Inpatient Glycemic Control (2015)  Target Ranges:  Prepandial:   less than 140 mg/dL      Peak postprandial:   less than 180 mg/dL (1-2 hours)      Critically ill patients:  140 - 180 mg/dL   Results for Beth Pope, Beth Pope (MRN [id number]) as of 08/27/2018 12:25  Ref. Range 08/26/2018 07:54 08/26/2018 11:34 08/26/2018 15:40 08/26/2018 19:49  Glucose-Capillary Latest Ref Range: 70 - 99 mg/dL 937 (H)  11 units NOVOLOG  228 (H)  5 units NOVOLOG +  8 units LANTUS at 10am  199 (H)  3 units NOVOLOG  258 (H)  8 units NOVOLOG +  8 units LANTUS given at 10pm    Results for Beth Pope, Beth Pope (MRN [id number]) as of 08/27/2018 12:25  Ref. Range 08/26/2018 23:39 08/27/2018 03:41 08/27/2018 08:05 08/27/2018 11:29  Glucose-Capillary Latest Ref Range: 70 - 99 mg/dL 329 (H)  5 units NOVOLOG  148 (H)  2 units NOVOLOG  183 (H)  3 units NOVOLOG  257 (H)  8 units NOVOLOG +  10 units LANTUS      Home DM Meds: Humalog 75/25 Insulin- 28 units AM/ 22 units PM   Current Orders: Lantus 10 units BID       Novolog Moderate Correction Scale/ SSI (0-15 units) Q4 hours     Currently Intubated.  Getting tube feeds 20cc/hour.  Note Lantus increased to 10 units BID today.    MD- If CBGs remain elevated despite increase of Lantus, please consider adding Novolog Tube Feed coverage:  Novolog 3 units Q4 hours  HOLD if tube feeds HELD for any reason      --Will follow patient during hospitalization--  Ambrose Finland RN, MSN, CDE Diabetes Coordinator Inpatient Glycemic Control Team Team Pager: [phone] (8a-5p)

## 2018-08-27 NOTE — Progress Notes (Signed)
Per Dr. Warren Danes note patient's stated systolic bp goal is <160.  There is a descrepanc between this and the admin instruction for the Hydralzine prn.  Will go with Dr. Warren Danes stated goals.

## 2018-08-27 NOTE — Progress Notes (Signed)
STROKE TEAM PROGRESS NOTE   INTERVAL HISTORY Patient still intubated, RN at bedside.  Off sedation this morning.  Patient still not open eyes on voice or stimulation, not following commands.  Left side moving more stronger than right side.  T-max 100.6 this morning.  Had long discussion with daughter over the phone, she is asking for DNR but would like to continue current treatment but stated that patient does not want PEG and trach if that is the case.  Vitals:   08/27/18 0500 08/27/18 0600 08/27/18 0700 08/27/18 0804  BP: (!) 115/34 (!) 120/36 (!) 126/42 91/62  Pulse: 77 81 79 84  Resp: 16 16 (!) 0 16  Temp:      TempSrc:      SpO2: 100% 100% 100% 100%  Weight:      Height:        CBC:  Recent Labs  Lab 2018-09-24 2217  08/27/18 0401 08/27/18 0432  WBC 5.9  --   --  11.7*  NEUTROABS 2.8  --   --   --   HGB 12.0   < > 11.2* 10.0*  HCT 41.2   < > 33.0* 32.7*  MCV 71.9*  --   --  71.2*  PLT 160  --   --  137*   < > = values in this interval not displayed.    Basic Metabolic Panel:  Recent Labs  Lab Sep 24, 2018 2217 09/24/18 2223  08/26/18 1710 08/27/18 0401 08/27/18 0432  NA 135  --    < >  --  139 138  K 5.0  --    < >  --  3.3* 3.2*  CL 99  --   --   --   --  106  CO2 28  --   --   --   --  24  GLUCOSE 331*  --   --   --   --  171*  BUN 8  --   --   --   --  13  CREATININE 1.24* 1.20*  --   --   --  0.75  CALCIUM 8.9  --   --   --   --  8.5*  MG  --   --   --  1.7  --  1.8  PHOS  --   --   --  3.2  --  3.5   < > = values in this interval not displayed.   Lipid Panel:     Component Value Date/Time   CHOL 178 08/26/2018 0500   TRIG 191 (H) 08/26/2018 0500   HDL 38 (L) 08/26/2018 0500   CHOLHDL 4.7 08/26/2018 0500   VLDL 38 08/26/2018 0500   LDLCALC 102 (H) 08/26/2018 0500   HgbA1c:  Lab Results  Component Value Date   HGBA1C 10.4 (H) 08/26/2018   Urine Drug Screen: No results found for: LABOPIA, COCAINSCRNUR, LABBENZ, AMPHETMU, THCU, LABBARB  Alcohol  Level     Component Value Date/Time   ETH <10 09/24/2018 2225    IMAGING Ct Head Wo Contrast  Result Date: 08/26/2018 CLINICAL DATA:  Status post tPA with intracranial hemorrhage. EXAM: CT HEAD WITHOUT CONTRAST TECHNIQUE: Contiguous axial images were obtained from the base of the skull through the vertex without intravenous contrast. COMPARISON:  Head CT 08/26/2018 FINDINGS: Brain: Allowing for the a small amount of redistribution, the size and distribution of hemorrhagic sites within the brain are unchanged. There is no midline shift or other mass effect.  Size and configuration of the ventricles are unchanged. Vascular: No abnormal hyperdensity of the major intracranial arteries or dural venous sinuses. No intracranial atherosclerosis. Skull: The visualized skull base, calvarium and extracranial soft tissues are normal. Sinuses/Orbits: No fluid levels or advanced mucosal thickening of the visualized paranasal sinuses. No mastoid or middle ear effusion. The orbits are normal. IMPRESSION: Unchanged size and distribution of hemorrhagic foci within the brain. Electronically Signed   By: Deatra Robinson M.D.   On: 08/26/2018 23:31   Ct Head Wo Contrast  Result Date: 08/26/2018 CLINICAL DATA:  Intracranial hemorrhage follow up, status post tPA EXAM: CT HEAD WITHOUT CONTRAST TECHNIQUE: Contiguous axial images were obtained from the base of the skull through the vertex without intravenous contrast. COMPARISON:  08/26/2018 FINDINGS: Brain: Intracranial hemorrhage has worsened since the prior scan. There is a new focus of intraparenchymal hemorrhage in the right temporal lobe that measures 3.3 x 1.5 x 1.6 cm (volume = 4.1 cm^3). Bilateral occipital lobe hemorrhages are slightly larger. Distribution of subarachnoid blood is unchanged. No midline shift or other mass effect. Vascular: No abnormal hyperdensity of the major intracranial arteries or dural venous sinuses. No intracranial atherosclerosis. Skull: The  visualized skull base, calvarium and extracranial soft tissues are normal. Sinuses/Orbits: No fluid levels or advanced mucosal thickening of the visualized paranasal sinuses. No mastoid or middle ear effusion. The orbits are normal. IMPRESSION: 1. Worsening intracranial hemorrhage, most notably within the right temporal lobe, where there is a new intraparenchymal hematoma that measures 4.1 mL. 2. Slightly increased size of multiple other small foci of hemorrhage with unchanged distribution of subarachnoid blood. 3. No midline shift or other mass effect. 4. Critical Value/emergent results were called by telephone at the time of interpretation on 08/26/2018 at 1:54 am to Dr. Georgiana Spinner Aroor, who verbally acknowledged these results. Electronically Signed   By: Deatra Robinson M.D.   On: 08/26/2018 01:55   Ct Head Wo Contrast  Addendum Date: 08/26/2018   ADDENDUM REPORT: 08/26/2018 00:37 ADDENDUM: Critical Value/emergent results were called by telephone at the time of interpretation on 08/26/2018 at 12:36 am to Dr. Georgiana Spinner Aroor , who verbally acknowledged these results. Electronically Signed   By: Deatra Robinson M.D.   On: 08/26/2018 00:37   Result Date: 08/26/2018 CLINICAL DATA:  Status post tPA EXAM: CT HEAD WITHOUT CONTRAST TECHNIQUE: Contiguous axial images were obtained from the base of the skull through the vertex without intravenous contrast. COMPARISON:  None. FINDINGS: Brain: There is multifocal subarachnoid and parenchymal hemorrhage within both hemispheres. Largest area hemorrhages in the left frontal lobe, measuring approximately 1.6 x 1.0 x 1.6 cm (volume = 1.3 cm^3). Smaller foci of hemorrhage are located within both occipital lobes. The subarachnoid blood is greatest over the right parietal convexity. No midline shift or other mass effect. There is periventricular hypoattenuation compatible with chronic microvascular disease. Vascular: No abnormal hyperdensity of the major intracranial arteries or dural  venous sinuses. No intracranial atherosclerosis. Skull: The visualized skull base, calvarium and extracranial soft tissues are normal. Sinuses/Orbits: No fluid levels or advanced mucosal thickening of the visualized paranasal sinuses. No mastoid or middle ear effusion. The orbits are normal. IMPRESSION: Multifocal acute intraparenchymal and subarachnoid hemorrhage involving both hemispheres, greatest in the left frontal lobe. No associated mass effect. Electronically Signed: By: Deatra Robinson M.D. On: 08/26/2018 00:28   Mr Brain Wo Contrast  Result Date: 08/26/2018 CLINICAL DATA:  LEFT brain infarct status post tPA with resultant intracranial hemorrhage. EXAM: MRI HEAD WITHOUT CONTRAST MRA  HEAD WITHOUT CONTRAST TECHNIQUE: Multiplanar, multiecho pulse sequences of the brain and surrounding structures were obtained without intravenous contrast. Angiographic images of the head were obtained using MRA technique without contrast. COMPARISON:  Prior CT exams, most recent earlier today at 1:33 a.m. Most recent prior MR 02/10/2018. FINDINGS: MRI HEAD FINDINGS Brain: Apparent increase in size and number of multiple intracranial hemorrhages of an acute nature. Largest index lesion, measured on susceptibility weighting series 10, is 2 x 4 cm, increased from 1.5 x 3 cm earlier. Similar increase in medial RIGHT occipital lesion now 2 cm diameter, previous less than 1.5 cm. There is a posterior predominance of lesions, in the RIGHT greater than LEFT occipital and posterior parietal lobe, RIGHT greater than LEFT temporal lobe, raising the question of PRES. Numerous punctate foci of hemosiderin deposition were present on the previous MR from September 2019, and the imaging findings could represent manifestation of underlying hypertensive cerebrovascular disease versus cerebral amyloid angiopathy, complicated by post tPA hemorrhage. Baseline atrophy and small vessel disease. No extra-axial collections. Vascular: Reported  separately. Skull and upper cervical spine: Normal marrow signal. Sinuses/Orbits: Noncontributory Other: None MRA HEAD FINDINGS The internal carotid arteries are widely patent. The basilar artery is widely patent with vertebrals codominant. Fetal RIGHT PCA. No proximal intracranial stenosis or saccular aneurysm. No visible changes of vasculitis or vasospasm. IMPRESSION: Increase in both size and number of multiple intracranial hemorrhages of an acute nature. Considerations include hypertensive cerebrovascular disease, cerebral amyloid angiopathy, complication of tPA, or hemorrhagic PRES. MRA of the intracranial circulation demonstrates no flow-limiting stenosis, large vessel vasculitis, vasospasm, or other significant finding. These results were called by telephone at the time of interpretation on 08/26/2018 at 3:25 pm to Dr. Roda Shutters , who verbally acknowledged these results. Electronically Signed   By: Elsie Stain M.D.   On: 08/26/2018 15:32   Dg Chest Port 1 View  Result Date: 08/27/2018 CLINICAL DATA:  Respiratory failure.  ET tube placement. EXAM: PORTABLE CHEST 1 VIEW COMPARISON:  08/26/2018 FINDINGS: Normal heart size. Aortic atherosclerosis. The endotracheal tube tip is 1.5 cm above the carina. There is an enteric 2 with tip projecting over the expected location of the body of stomach. Side port is below the level of the GE junction. Small bilateral pleural effusions and mild pulmonary vascular congestion. Atelectasis is noted in both lung bases. IMPRESSION: Small bilateral pleural effusions and bibasilar atelectasis. Electronically Signed   By: Signa Kell M.D.   On: 08/27/2018 08:02   Dg Chest Port 1 View  Result Date: 08/26/2018 CLINICAL DATA:  Intubation. EXAM: PORTABLE CHEST 1 VIEW COMPARISON:  02/14/2018 FINDINGS: Endotracheal tube tip slightly low in positioning 16 mm from the carina. Tip and side port of the enteric tube below the diaphragm in the stomach. Low lung volumes. Unchanged heart size  and mediastinal contours with aortic tortuosity. Bibasilar atelectasis. No evidence of pulmonary edema, large pleural effusion or pneumothorax. Scattered peripheral fibrosis. IMPRESSION: 1. Endotracheal tube tip slightly low in positioning 16 mm from the carina, consider retraction of 1-2 cm. Enteric tube in place. 2. Low lung volumes with bibasilar atelectasis. Electronically Signed   By: Narda Rutherford M.D.   On: 08/26/2018 00:50   Mr Shirlee Latch ZO Contrast  Result Date: 08/26/2018 CLINICAL DATA:  LEFT brain infarct status post tPA with resultant intracranial hemorrhage. EXAM: MRI HEAD WITHOUT CONTRAST MRA HEAD WITHOUT CONTRAST TECHNIQUE: Multiplanar, multiecho pulse sequences of the brain and surrounding structures were obtained without intravenous contrast. Angiographic images of the head  were obtained using MRA technique without contrast. COMPARISON:  Prior CT exams, most recent earlier today at 1:33 a.m. Most recent prior MR 02/10/2018. FINDINGS: MRI HEAD FINDINGS Brain: Apparent increase in size and number of multiple intracranial hemorrhages of an acute nature. Largest index lesion, measured on susceptibility weighting series 10, is 2 x 4 cm, increased from 1.5 x 3 cm earlier. Similar increase in medial RIGHT occipital lesion now 2 cm diameter, previous less than 1.5 cm. There is a posterior predominance of lesions, in the RIGHT greater than LEFT occipital and posterior parietal lobe, RIGHT greater than LEFT temporal lobe, raising the question of PRES. Numerous punctate foci of hemosiderin deposition were present on the previous MR from September 2019, and the imaging findings could represent manifestation of underlying hypertensive cerebrovascular disease versus cerebral amyloid angiopathy, complicated by post tPA hemorrhage. Baseline atrophy and small vessel disease. No extra-axial collections. Vascular: Reported separately. Skull and upper cervical spine: Normal marrow signal. Sinuses/Orbits:  Noncontributory Other: None MRA HEAD FINDINGS The internal carotid arteries are widely patent. The basilar artery is widely patent with vertebrals codominant. Fetal RIGHT PCA. No proximal intracranial stenosis or saccular aneurysm. No visible changes of vasculitis or vasospasm. IMPRESSION: Increase in both size and number of multiple intracranial hemorrhages of an acute nature. Considerations include hypertensive cerebrovascular disease, cerebral amyloid angiopathy, complication of tPA, or hemorrhagic PRES. MRA of the intracranial circulation demonstrates no flow-limiting stenosis, large vessel vasculitis, vasospasm, or other significant finding. These results were called by telephone at the time of interpretation on 08/26/2018 at 3:25 pm to Dr. Roda Shutters , who verbally acknowledged these results. Electronically Signed   By: Elsie Stain M.D.   On: 08/26/2018 15:32   Ct Head Code Stroke Wo Contrast  Result Date: 08/16/2018 CLINICAL DATA:  Code stroke. Right-sided weakness. Last seen normal 21:30. EXAM: CT HEAD WITHOUT CONTRAST TECHNIQUE: Contiguous axial images were obtained from the base of the skull through the vertex without intravenous contrast. COMPARISON:  02/10/2018 FINDINGS: Brain: There is no mass, hemorrhage or extra-axial collection. The size and configuration of the ventricles and extra-axial CSF spaces are normal. There is hypoattenuation of the periventricular white matter, most commonly indicating chronic ischemic microangiopathy. Vascular: No abnormal hyperdensity of the major intracranial arteries or dural venous sinuses. No intracranial atherosclerosis. Skull: The visualized skull base, calvarium and extracranial soft tissues are normal. Sinuses/Orbits: No fluid levels or advanced mucosal thickening of the visualized paranasal sinuses. No mastoid or middle ear effusion. The orbits are normal. ASPECTS [facility] Stroke Program Early CT Score) - Ganglionic level infarction (caudate, lentiform nuclei,  internal capsule, insula, M1-M3 cortex): 7 - Supraganglionic infarction (M4-M6 cortex): 3 Total score (0-10 with 10 being normal): 10 IMPRESSION: 1. No acute intracranial abnormality. 2. ASPECTS is 10. * These results were communicated to Dr. Georgiana Spinner Aroor at 10:31 pm on 08/10/2018 by text page via the [facility] messaging system. Electronically Signed   By: Deatra Robinson M.D.   On: 08/24/2018 22:32   Carotid (at [facility])  Result Date: 08/26/2018 Carotid Arterial Duplex Study Indications: CVA. Limitations: Ventilator, Patient immobility, patient positioning Performing Technologist: Chanda Busing RVT  Examination Guidelines: A complete evaluation includes B-mode imaging, spectral Doppler, color Doppler, and power Doppler as needed of all accessible portions of each vessel. Bilateral testing is considered an integral part of a complete examination. Limited examinations for reoccurring indications may be performed as noted.  Right Carotid Findings: +----------+--------+--------+--------+---------------------+------------------+           PSV  cm/sEDV cm/sStenosisDescribe             Comments           +----------+--------+--------+--------+---------------------+------------------+ CCA Prox  141     0               smooth and                                                                heterogenous                            +----------+--------+--------+--------+---------------------+------------------+ CCA Distal113     8               smooth and                                                                heterogenous                            +----------+--------+--------+--------+---------------------+------------------+ ICA Prox  82      7                                    High resistant                                                            flow               +----------+--------+--------+--------+---------------------+------------------+ ICA  Distal75      9                                    High resistant                                                            flow               +----------+--------+--------+--------+---------------------+------------------+ ECA       71      0                                                       +----------+--------+--------+--------+---------------------+------------------+ +----------+--------+-------+--------+-------------------+           PSV cm/sEDV cmsDescribeArm Pressure (mmHG) +----------+--------+-------+--------+-------------------+ [id number]                                        +----------+--------+-------+--------+-------------------+ +---------+--------+--+--------+-+--------------+  VertebralPSV cm/s65EDV cm/s6High resistant +---------+--------+--+--------+-+--------------+  Left Carotid Findings: +----------+--------+--------+--------+---------------------+------------------+           PSV cm/sEDV cm/sStenosisDescribe             Comments           +----------+--------+--------+--------+---------------------+------------------+ CCA Prox  139     0                                    tortuous           +----------+--------+--------+--------+---------------------+------------------+ CCA Distal144     11              smooth and                                                                heterogenous                            +----------+--------+--------+--------+---------------------+------------------+ ICA Prox  93      7               smooth and           High resistant                                       heterogenous         flow               +----------+--------+--------+--------+---------------------+------------------+ ICA Distal124     8                                    High resistant                                                            flow                +----------+--------+--------+--------+---------------------+------------------+ ECA       91      0                                                       +----------+--------+--------+--------+---------------------+------------------+ +----------+--------+--------+--------+-------------------+ SubclavianPSV cm/sEDV cm/sDescribeArm Pressure (mmHG) +----------+--------+--------+--------+-------------------+           278                                         +----------+--------+--------+--------+-------------------+ +---------+--------+--+--------+-+--------------+ VertebralPSV cm/s96EDV cm/s7High resistant +---------+--------+--+--------+-+--------------+  Summary: Right Carotid: Velocities in the right ICA are consistent with a 1-39% stenosis.  High resistant waveforms are noted in the proximal and distal ICA                and the vertebral artery. Left Carotid: Velocities in the left ICA are consistent with a 1-39% stenosis.               High resistant waveforms are noted in the proximal and distal ICA               and the vertebral artery.  *See table(s) above for measurements and observations.     Preliminary    2D Echocardiogram  1. The left ventricle has hyperdynamic systolic function, with an ejection fraction of >65%. The cavity size was normal. There is mildly increased left ventricular wall thickness. Left ventricular diastolic Doppler parameters are consistent with  impaired relaxation. Indeterminate filling pressures The E/e' is 8-15. No evidence of left ventricular regional wall motion abnormalities.  2. The right ventricle has normal systolic function. The cavity was normal. There is no increase in right ventricular wall thickness.  3. The mitral valve is grossly normal.  4. The tricuspid valve is grossly normal.  5. The aortic valve is tricuspid Mild sclerosis of the aortic valve.  6. The inferior vena cava was normal in size with <50% respiratory  variability.   PHYSICAL EXAM Temp:  [98.3 F (36.8 C)-101.5 F (38.6 C)] 98.3 F (36.8 C) (03/25 0400) Pulse Rate:  [75-96] 84 (03/25 0804) Resp:  [0-26] 16 (03/25 0804) BP: (91-146)/(34-101) 91/62 (03/25 0804) SpO2:  [99 %-100 %] 100 % (03/25 0804) Arterial Line BP: (109-161)/(37-61) 121/61 (03/25 0700) FiO2 (%):  [40 %-70 %] 40 % (03/25 0804)  General - Well nourished, well developed, intubated just off sedation.  Ophthalmologic - fundi not visualized due to noncooperation.  Cardiovascular - Regular rate and rhythm.  Neuro - intubated off sedation, eyes closed, not following commands. With forced eye opening, eyes in mid position, not blinking to visual threat, doll's eyes sluggish, not tracking, PERRL. Corneal reflex weakly present bilaterally, gag and cough present. Breathing over the vent.  Facial symmetry not able to test due to ET tube.  Tongue midline in mouth. On pain stimulation, 3/5 LUE and 2/5 RUE, as well as 2+/5 LLE and and 2/5 RLE. DTR diminished and no babinski. Sensation, coordination and gait not tested.   ASSESSMENT/PLAN Ms. Beth Pope is a 78 y.o. female with history of DB, HTN, COPD presenting with R sided weakness, facial droop and slurred speech. BP 230 en route. CT neg. Received tPA 09-22-18 at 2238. HA, mental status change and fluctuating BP post tPA. CT showed SAH and ICH. Reversed w/ TXA.   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 ASPECTS 10.   CT head 3/24 0006 multifocal B IPH and SAH. No mass effect  CT head  3/24 0144 worsening ICH w/ new R temporal hmg 4mL. slightly increased size of other small ICH with unchanged SAH. No shift.  MRI 3/24 1458 stable in size and number of mult IC  hmgs.  MRA  Unremarkable   CT head 3/24 2113 unchanged size, distribution hmgs  Carotid Doppler B ICA 1-39% stenosis, VAs antegrade   2D Echo  EF > 65%. No source of embolus  LDL 102  HgbA1c 10.4  Fibrinogen 336 s/p TXA  SCDs for VTE prophylaxis  No antithrombotic prior to admission, now on No antithrombotic given post tPA hemorrhage   Started on Keppra 500 q 12h for seizure prophylaxis - will continue for total 7 days - end date adjusted under orders  Therapy recommendations:  pending   Disposition:  pending   Code status - DNR - had long discussion with daughter and she requested continue current care but stated that pt would not want PEG or Trach   Hypertensive Emergency  BP 230 per EMS  Lowered w. labetalol for tPA administration   Required cleviprex post tPA for fluctuating BP control  Remains on cleviprex, taper off if able  SBP goal < 160  Add po BP meds with amlodipine 10, coreg 6.25 bid, lisinopril 20  Close monitoring  Acute Reparatory Failure  Unable to protect airway following ICH after tPA  Intubated in ED  Just off propofol   CCM on board  Family stated that pt would not want PEG or Trach  Fever  Tmax 101.5  CXR small b/l pleural effusion and atelectasis b/l basilar   CCM on board  Ice pack and tylenol PRN  Close monitoring  Dysaphgia  Secondary to stroke  NPO  On tube feeding  Resume some home meds via OG  Hyperlipidemia  Home meds:  No statin  LDL 102, goal < 70  Hold statin given hemorrhage  Consider statin at discharge based on plan of care  Diabetes type II  HgbA1c 10.4, goal < 7.0  Uncontrolled  On lantus now  Hyperglycemia   CBGs   SSI  CCM on board  Other Stroke Risk Factors  Advanced age  Obesity, Body mass index is 34.6 kg/m., recommend weight loss, diet and exercise as appropriate   Other Active Problems  AKI, resolved Cr 1.2>0.75  COPD  Anemia, Hgn  13.9>11.2>10  Hypokalemia 4.1>3.3>3.2  Hospital day # 2  This patient is critically ill due to stroke, hemorrhage s/p tPA, hyperglycemia, hypertension, respiratory failure and at significant risk of neurological worsening, death form hematoma expansion, recurrent stroke, cerebral edema, seizure, brain herniation. This patient's care requires constant monitoring of vital signs, hemodynamics, respiratory and cardiac monitoring, review of multiple databases, neurological assessment, discussion with family, other specialists and medical decision making of high complexity. I spent 40 minutes of neurocritical care time in the care of this patient. I had long discussion with daughter over the phone, updated pt current condition, treatment plan and potential prognosis. She expressed understanding and appreciation. She requested DNR and stated that pt would not want PEG or Trach but would like to continue current care at this time.   Marvel Plan, MD PhD Stroke Neurology 08/27/2018 8:40 AM   To contact Stroke Continuity provider, please refer to [email or web address]. After hours, contact General Neurology

## 2018-08-27 NOTE — Progress Notes (Addendum)
NAME:  Beth Pope, MRN:  [id number], DOB:  09-13-1940, LOS: 2 ADMISSION DATE:  09-07-2018, CONSULTATION DATE:  08/26/18 REFERRING MD:  Aroor  CHIEF COMPLAINT:  AMS   Brief History   Beth Pope is a 78 y.o. female who was admitted 3/23 with concern for CVA (NIH 6).  She received tPA and later had ICH.  She required intubation and was then transferred to the neuro ICU. She received 1g of TXA and was then transferred to the neuro ICU and PCCM was asked to see in consultation.  Past Medical History  HTN, COPD, DM.  Significant Hospital Events   3/23 Admit. 3/24 Intubated after ICH following tPA.  Consults:  PCCM.  Procedures:  ETT 3/24 >> L radial a line 3/24 >>  Significant Diagnostic Tests:  CT head 3/23 > negative. CT head 3/24 > multifocal IPH and SAH. CT head 3/24 > worsening ICH with new IPH.  Slightly increased size of multiple other small foci of hemorrhage. MRI/MRA brain 3/24 > increase in both size and number of multiple intracranial hemorrhages of an acute nature, MRA with no flow limiting stenosis or acute findings  Echo 3/24 > LVEF 65-70%, mild LVH, grade 1 DD  Micro Data:  Tracheal Aspirate 3/24 >>   Antimicrobials:     Interim history/subjective:  RN reports labile BP on cleviprex.  CBG's remain elevated.  Tmax 100.6   Objective:  Blood pressure (!) 132/39, pulse 91, temperature (!) 100.6 F (38.1 C), temperature source Axillary, resp. rate 10, height 5\' 3"  (1.6 m), weight 88.6 kg, SpO2 100 %.    Vent Mode: PRVC FiO2 (%):  [40 %-70 %] 40 % Set Rate:  [16 bmp] 16 bmp Vt Set:  [420 mL] 420 mL PEEP:  [5 cmH20] 5 cmH20 Plateau Pressure:  [19 cmH20-21 cmH20] 19 cmH20   Intake/Output Summary (Last 24 hours) at 08/27/2018 1017 Last data filed at 08/27/2018 9093 Gross per 24 hour  Intake 2535.21 ml  Output 300 ml  Net 2235.21 ml   Filed Weights   Sep 07, 2018 2200  Weight: 88.6 kg    Examination: General: elderly female lying in bed, critically  ill appearing  HEENT: MM pink/moist, ETT Neuro: sedate, movement of LLE with stimulation, no response to verbal stimuli, no follow commands CV: s1s2 rrr, no m/r/g PULM: even/non-labored, lungs bilaterally clear  GI: soft, bsx4 hypoactive  Extremities: warm/dry, no edema  Skin: no rashes or lesions  Assessment & Plan:   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  S/p 1g TXA P: Further neuro recommendations per Neurology  Continue Keppra  Follow neuro exam   Acute Respiratory Insufficiency  -due to inability to protect the airway in the setting of CVA / ICH. P: PRVC 8cc/kg  Wean PEEP / FiO2 for sats > 90% SBT as tolerated, mental status barrier for extubation at this point  Follow CXR, ABG intermittently   Hypertensive Emergency P: Continue cleviprex, SBP goal <140 per Neurology  PRN hydralazine for SBP goal  Hold home norvasc  AKI -in setting of hypertensive emergency  Hypokalemia  P: NS at 20ml/hr  Trend BMP / urinary output Replace electrolytes as indicated, KCL 3/25 Avoid nephrotoxic agents, ensure adequate renal perfusion  Anemia  Thrombocytopenia  P: Trend CBC  Transfuse per ICU guidelines  Monitor for bleeding   DM P: SSI  Increase lantus to 10 units BID  May need to add "meal coverage"   At Risk Malnutrition  P: Continue  TF per Nutrition   Best Practice:  Diet: NPO / TF    Pain/Anxiety/Delirium protocol (if indicated):in place  VAP protocol (if indicated): In place. DVT prophylaxis: SCD's. GI prophylaxis: PPI. Glucose control: SSI. Mobility: Bedrest. Code Status: Full. Family Communication: No family at bedside due to restrictions.  Disposition: ICU.  Labs   CBC: Recent Labs  Lab 08/20/2018 2217 08/26/18 0156 08/27/18 0401 08/27/18 0432  WBC 5.9  --   --  11.7*  NEUTROABS 2.8  --   --   --   HGB 12.0 13.9 11.2* 10.0*  HCT 41.2 41.0 33.0* 32.7*  MCV 71.9*  --   --  71.2*  PLT 160  --   --  137*    Basic Metabolic Panel: Recent Labs  Lab 08/08/2018 2217 08/17/2018 2223 08/26/18 0156 08/26/18 1710 08/27/18 0401 08/27/18 0432  NA 135  --  138  --  139 138  K 5.0  --  4.1  --  3.3* 3.2*  CL 99  --   --   --   --  106  CO2 28  --   --   --   --  24  GLUCOSE 331*  --   --   --   --  171*  BUN 8  --   --   --   --  13  CREATININE 1.24* 1.20*  --   --   --  0.75  CALCIUM 8.9  --   --   --   --  8.5*  MG  --   --   --  1.7  --  1.8  PHOS  --   --   --  3.2  --  3.5   GFR: Estimated Creatinine Clearance: 59.2 mL/min (by C-G formula based on SCr of 0.75 mg/dL). Recent Labs  Lab 08/09/2018 2217 08/27/18 0432  WBC 5.9 11.7*   Liver Function Tests: Recent Labs  Lab 08/30/2018 2217  AST 27  ALT 13  ALKPHOS 93  BILITOT 1.0  PROT 7.0  ALBUMIN 3.8   No results for input(s): LIPASE, AMYLASE in the last 168 hours. No results for input(s): AMMONIA in the last 168 hours. ABG    Component Value Date/Time   PHART 7.384 08/27/2018 0401   PCO2ART 44.1 08/27/2018 0401   PO2ART 188.0 (H) 08/27/2018 0401   HCO3 26.3 08/27/2018 0401   TCO2 28 08/27/2018 0401   O2SAT 100.0 08/27/2018 0401    Coagulation Profile: Recent Labs  Lab 08/18/2018 2300  INR 1.0   Cardiac Enzymes: No results for input(s): CKTOTAL, CKMB, CKMBINDEX, TROPONINI in the last 168 hours. HbA1C: Hgb A1c MFr Bld  Date/Time Value Ref Range Status  08/26/2018 05:44 AM 10.4 (H) 4.8 - 5.6 % Final    Comment:    (NOTE) Pre diabetes:          5.7%-6.4% Diabetes:              >6.4% Glycemic control for   <7.0% adults with diabetes    CBG: Recent Labs  Lab 08/26/18 1540 08/26/18 1949 08/26/18 2339 08/27/18 0341 08/27/18 0805  GLUCAP 199* 258* 224* 148* 183*      Critical care time: 30 minutes    Canary Brim, NP-C [facility] Pgr: [phone] or if no answer [phone] 08/27/2018, 10:17 AM

## 2018-08-27 NOTE — Progress Notes (Signed)
Dropped FiO2 to 40% per ABG results.

## 2018-08-27 NOTE — Progress Notes (Signed)
OT Cancellation Note  Patient Details Name: Beth Pope MRN: [id number] DOB: 07-05-1940   Cancelled Treatment:    Reason Eval/Treat Not Completed: Patient not medically ready(Intubated and sedated. Will hold and return as pt medically ready. Thank you.)  Rudy Luhmann M Jaylynne Birkhead Yuniel Blaney MSOT, OTR/L Acute Rehab Pager: [phone] Office: [phone] 08/27/2018, 7:51 AM

## 2018-08-27 NOTE — Progress Notes (Signed)
PT Cancellation Note  Patient Details Name: Beth Pope MRN: [id number] DOB: 1940/07/22   Cancelled Treatment:    Reason Eval/Treat Not Completed: Patient not medically ready. Per RN, recent MRIs show worsening of ICH and patient now less responsive, close to non-responsive compared to yesterday. Acute PT to return as able, if appropriate to complete PT eval.  [name], PT, DPT Acute Rehabilitation Services Pager #: [phone] Office #: [phone]    Beth Pope 08/27/2018, 7:52 AM

## 2018-08-28 ENCOUNTER — Inpatient Hospital Stay (HOSPITAL_COMMUNITY): Payer: Medicare Other

## 2018-08-28 LAB — GLUCOSE, CAPILLARY
GLUCOSE-CAPILLARY: 208 mg/dL — AB (ref 70–99)
Glucose-Capillary: 186 mg/dL — ABNORMAL HIGH (ref 70–99)
Glucose-Capillary: 272 mg/dL — ABNORMAL HIGH (ref 70–99)
Glucose-Capillary: 119 mg/dL — ABNORMAL HIGH (ref 70–99)
Glucose-Capillary: 184 mg/dL — ABNORMAL HIGH (ref 70–99)
Glucose-Capillary: 185 mg/dL — ABNORMAL HIGH (ref 70–99)

## 2018-08-28 LAB — CBC
HCT: 32.3 % — ABNORMAL LOW (ref 36.0–46.0)
Hemoglobin: 9.9 g/dL — ABNORMAL LOW (ref 12.0–15.0)
MCH: 22 pg — AB (ref 26.0–34.0)
MCHC: 30.7 g/dL (ref 30.0–36.0)
MCV: 71.6 fL — AB (ref 80.0–100.0)
PLATELETS: 136 10*3/uL — AB (ref 150–400)
RBC: 4.51 MIL/uL (ref 3.87–5.11)
RDW: 14.6 % (ref 11.5–15.5)
WBC: 13.7 10*3/uL — ABNORMAL HIGH (ref 4.0–10.5)
nRBC: 0 % (ref 0.0–0.2)

## 2018-08-28 LAB — PHOSPHORUS: Phosphorus: 3.4 mg/dL (ref 2.5–4.6)

## 2018-08-28 LAB — COMPREHENSIVE METABOLIC PANEL
ALT: 9 U/L (ref 0–44)
AST: 15 U/L (ref 15–41)
Albumin: 2.6 g/dL — ABNORMAL LOW (ref 3.5–5.0)
Alkaline Phosphatase: 54 U/L (ref 38–126)
Anion gap: 8 (ref 5–15)
BUN: 21 mg/dL (ref 8–23)
CO2: 22 mmol/L (ref 22–32)
Calcium: 8.9 mg/dL (ref 8.9–10.3)
Chloride: 107 mmol/L (ref 98–111)
Creatinine, Ser: 0.7 mg/dL (ref 0.44–1.00)
GFR calc Af Amer: >60 mL/min (ref >60)
Glucose, Bld: 221 mg/dL — ABNORMAL HIGH (ref 70–99)
Potassium: 4 mmol/L (ref 3.5–5.1)
Sodium: 137 mmol/L (ref 135–145)
Total Bilirubin: 0.6 mg/dL (ref 0.3–1.2)
Total Protein: 5.4 g/dL — ABNORMAL LOW (ref 6.5–8.1)

## 2018-08-28 LAB — MAGNESIUM: Magnesium: 1.9 mg/dL (ref 1.7–2.4)

## 2018-08-28 MED ORDER — LISINOPRIL 20 MG PO TABS
20.0000 mg | ORAL_TABLET | Freq: Two times a day (BID) | ORAL | Status: DC
  Administered 2018-08-28: 20 mg via ORAL
  Filled 2018-08-28: qty 1

## 2018-08-28 MED ORDER — HYDRALAZINE HCL 20 MG/ML IJ SOLN
10.0000 mg | INTRAMUSCULAR | Status: DC | PRN
  Administered 2018-08-28 (×2): 20 mg via INTRAVENOUS
  Administered 2018-08-29: 40 mg via INTRAVENOUS
  Administered 2018-08-29 – 2018-08-30 (×4): 20 mg via INTRAVENOUS
  Filled 2018-08-28 (×2): qty 2
  Filled 2018-08-28: qty 1
  Filled 2018-08-28: qty 2
  Filled 2018-08-28: qty 1

## 2018-08-28 MED ORDER — PANTOPRAZOLE SODIUM 40 MG PO PACK
40.0000 mg | PACK | Freq: Every day | ORAL | Status: DC
  Administered 2018-08-28 – 2018-09-02 (×6): 40 mg
  Filled 2018-08-28 (×6): qty 20

## 2018-08-28 MED ORDER — INSULIN ASPART 100 UNIT/ML ~~LOC~~ SOLN
3.0000 [IU] | SUBCUTANEOUS | Status: DC
  Administered 2018-08-28 – 2018-09-02 (×24): 3 [IU] via SUBCUTANEOUS

## 2018-08-28 MED ORDER — INSULIN GLARGINE 100 UNIT/ML ~~LOC~~ SOLN
12.0000 [IU] | Freq: Two times a day (BID) | SUBCUTANEOUS | Status: DC
  Administered 2018-08-28 – 2018-09-02 (×11): 12 [IU] via SUBCUTANEOUS
  Filled 2018-08-28 (×14): qty 0.12

## 2018-08-28 NOTE — Progress Notes (Signed)
STROKE TEAM PROGRESS NOTE   INTERVAL HISTORY Pt RN at bedside. Pt still has fever at 100.8. on sedation, intubated, not following commands. Left gaze. Brisk on the left UE and LE on pain, but weak at right UE and LE with pain stimulation.   Vitals:   08/28/18 0500 08/28/18 0600 08/28/18 0700 08/28/18 0803  BP: (!) 140/50 (!) 141/50 (!) 130/59   Pulse: 93 92 88 84  Resp: Temp:    (!) 100.8 F (38.2 C)  TempSrc:    Axillary  SpO2: 100% 100% 99% 99%  Weight: 90.8 kg     Height:       CBG (last 3)  Recent Labs    08/27/18 2326 08/28/18 0332 08/28/18 0826  GLUCAP 118* 208* 186*    CBC:  Recent Labs  Lab 08/19/2018 2217  08/27/18 0432 08/28/18 0411  WBC 5.9  --  11.7* 13.7*  NEUTROABS 2.8  --   --   --   HGB 12.0   < > 10.0* 9.9*  HCT 41.2   < > 32.7* 32.3*  MCV 71.9*  --  71.2* 71.6*  PLT 160  --  137* 136*   < > = values in this interval not displayed.    Basic Metabolic Panel:  Recent Labs  Lab 08/27/18 0432 08/27/18 1700 08/28/18 0411  NA 138  --  137  K 3.2*  --  4.0  CL 106  --  107  CO2 24  --  22  GLUCOSE 171*  --  221*  BUN 13  --  21  CREATININE 0.75  --  0.70  CALCIUM 8.5*  --  8.9  MG 1.8 1.8 1.9  PHOS 3.5 3.0 3.4   Lipid Panel:     Component Value Date/Time   CHOL 178 08/26/2018 0500   TRIG 191 (H) 08/26/2018 0500   HDL 38 (L) 08/26/2018 0500   CHOLHDL 4.7 08/26/2018 0500   VLDL 38 08/26/2018 0500   LDLCALC 102 (H) 08/26/2018 0500   HgbA1c:  Lab Results  Component Value Date   HGBA1C 10.4 (H) 08/26/2018   Urine Drug Screen: No results found for: LABOPIA, COCAINSCRNUR, LABBENZ, AMPHETMU, THCU, LABBARB  Alcohol Level     Component Value Date/Time   ETH <10 08/18/2018 2225    IMAGING Ct Head Wo Contrast  Result Date: 08/26/2018 CLINICAL DATA:  Status post tPA with intracranial hemorrhage. EXAM: CT HEAD WITHOUT CONTRAST TECHNIQUE: Contiguous axial images were obtained from the base of the skull through the vertex without  intravenous contrast. COMPARISON:  Head CT 08/26/2018 FINDINGS: Brain: Allowing for the a small amount of redistribution, the size and distribution of hemorrhagic sites within the brain are unchanged. There is no midline shift or other mass effect. Size and configuration of the ventricles are unchanged. Vascular: No abnormal hyperdensity of the major intracranial arteries or dural venous sinuses. No intracranial atherosclerosis. Skull: The visualized skull base, calvarium and extracranial soft tissues are normal. Sinuses/Orbits: No fluid levels or advanced mucosal thickening of the visualized paranasal sinuses. No mastoid or middle ear effusion. The orbits are normal. IMPRESSION: Unchanged size and distribution of hemorrhagic foci within the brain. Electronically Signed   By: Deatra Robinson M.D.   On: 08/26/2018 23:31   Ct Head Wo Contrast  Result Date: 08/26/2018 CLINICAL DATA:  Intracranial hemorrhage follow up, status post tPA EXAM: CT HEAD WITHOUT CONTRAST TECHNIQUE: Contiguous axial images were obtained from the base of the skull through  the vertex without intravenous contrast. COMPARISON:  08/26/2018 FINDINGS: Brain: Intracranial hemorrhage has worsened since the prior scan. There is a new focus of intraparenchymal hemorrhage in the right temporal lobe that measures 3.3 x 1.5 x 1.6 cm (volume = 4.1 cm^3). Bilateral occipital lobe hemorrhages are slightly larger. Distribution of subarachnoid blood is unchanged. No midline shift or other mass effect. Vascular: No abnormal hyperdensity of the major intracranial arteries or dural venous sinuses. No intracranial atherosclerosis. Skull: The visualized skull base, calvarium and extracranial soft tissues are normal. Sinuses/Orbits: No fluid levels or advanced mucosal thickening of the visualized paranasal sinuses. No mastoid or middle ear effusion. The orbits are normal. IMPRESSION: 1. Worsening intracranial hemorrhage, most notably within the right temporal lobe,  where there is a new intraparenchymal hematoma that measures 4.1 mL. 2. Slightly increased size of multiple other small foci of hemorrhage with unchanged distribution of subarachnoid blood. 3. No midline shift or other mass effect. 4. Critical Value/emergent results were called by telephone at the time of interpretation on 08/26/2018 at 1:54 am to Dr. Georgiana Spinner Aroor, who verbally acknowledged these results. Electronically Signed   By: Deatra Robinson M.D.   On: 08/26/2018 01:55   Ct Head Wo Contrast  Addendum Date: 08/26/2018   ADDENDUM REPORT: 08/26/2018 00:37 ADDENDUM: Critical Value/emergent results were called by telephone at the time of interpretation on 08/26/2018 at 12:36 am to Dr. Georgiana Spinner Aroor , who verbally acknowledged these results. Electronically Signed   By: Deatra Robinson M.D.   On: 08/26/2018 00:37   Result Date: 08/26/2018 CLINICAL DATA:  Status post tPA EXAM: CT HEAD WITHOUT CONTRAST TECHNIQUE: Contiguous axial images were obtained from the base of the skull through the vertex without intravenous contrast. COMPARISON:  None. FINDINGS: Brain: There is multifocal subarachnoid and parenchymal hemorrhage within both hemispheres. Largest area hemorrhages in the left frontal lobe, measuring approximately 1.6 x 1.0 x 1.6 cm (volume = 1.3 cm^3). Smaller foci of hemorrhage are located within both occipital lobes. The subarachnoid blood is greatest over the right parietal convexity. No midline shift or other mass effect. There is periventricular hypoattenuation compatible with chronic microvascular disease. Vascular: No abnormal hyperdensity of the major intracranial arteries or dural venous sinuses. No intracranial atherosclerosis. Skull: The visualized skull base, calvarium and extracranial soft tissues are normal. Sinuses/Orbits: No fluid levels or advanced mucosal thickening of the visualized paranasal sinuses. No mastoid or middle ear effusion. The orbits are normal. IMPRESSION: Multifocal acute  intraparenchymal and subarachnoid hemorrhage involving both hemispheres, greatest in the left frontal lobe. No associated mass effect. Electronically Signed: By: Deatra Robinson M.D. On: 08/26/2018 00:28   Mr Brain Wo Contrast  Result Date: 08/26/2018 CLINICAL DATA:  LEFT brain infarct status post tPA with resultant intracranial hemorrhage. EXAM: MRI HEAD WITHOUT CONTRAST MRA HEAD WITHOUT CONTRAST TECHNIQUE: Multiplanar, multiecho pulse sequences of the brain and surrounding structures were obtained without intravenous contrast. Angiographic images of the head were obtained using MRA technique without contrast. COMPARISON:  Prior CT exams, most recent earlier today at 1:33 a.m. Most recent prior MR 02/10/2018. FINDINGS: MRI HEAD FINDINGS Brain: Apparent increase in size and number of multiple intracranial hemorrhages of an acute nature. Largest index lesion, measured on susceptibility weighting series 10, is 2 x 4 cm, increased from 1.5 x 3 cm earlier. Similar increase in medial RIGHT occipital lesion now 2 cm diameter, previous less than 1.5 cm. There is a posterior predominance of lesions, in the RIGHT greater than LEFT occipital and posterior parietal lobe,  RIGHT greater than LEFT temporal lobe, raising the question of PRES. Numerous punctate foci of hemosiderin deposition were present on the previous MR from September 2019, and the imaging findings could represent manifestation of underlying hypertensive cerebrovascular disease versus cerebral amyloid angiopathy, complicated by post tPA hemorrhage. Baseline atrophy and small vessel disease. No extra-axial collections. Vascular: Reported separately. Skull and upper cervical spine: Normal marrow signal. Sinuses/Orbits: Noncontributory Other: None MRA HEAD FINDINGS The internal carotid arteries are widely patent. The basilar artery is widely patent with vertebrals codominant. Fetal RIGHT PCA. No proximal intracranial stenosis or saccular aneurysm. No visible  changes of vasculitis or vasospasm. IMPRESSION: Increase in both size and number of multiple intracranial hemorrhages of an acute nature. Considerations include hypertensive cerebrovascular disease, cerebral amyloid angiopathy, complication of tPA, or hemorrhagic PRES. MRA of the intracranial circulation demonstrates no flow-limiting stenosis, large vessel vasculitis, vasospasm, or other significant finding. These results were called by telephone at the time of interpretation on 08/26/2018 at 3:25 pm to Dr. Roda Shutters , who verbally acknowledged these results. Electronically Signed   By: Elsie Stain M.D.   On: 08/26/2018 15:32   Dg Chest Port 1 View  Result Date: 08/27/2018 CLINICAL DATA:  Respiratory failure.  ET tube placement. EXAM: PORTABLE CHEST 1 VIEW COMPARISON:  08/26/2018 FINDINGS: Normal heart size. Aortic atherosclerosis. The endotracheal tube tip is 1.5 cm above the carina. There is an enteric 2 with tip projecting over the expected location of the body of stomach. Side port is below the level of the GE junction. Small bilateral pleural effusions and mild pulmonary vascular congestion. Atelectasis is noted in both lung bases. IMPRESSION: Small bilateral pleural effusions and bibasilar atelectasis. Electronically Signed   By: Signa Kell M.D.   On: 08/27/2018 08:02   Dg Chest Port 1 View  Result Date: 08/26/2018 CLINICAL DATA:  Intubation. EXAM: PORTABLE CHEST 1 VIEW COMPARISON:  02/14/2018 FINDINGS: Endotracheal tube tip slightly low in positioning 16 mm from the carina. Tip and side port of the enteric tube below the diaphragm in the stomach. Low lung volumes. Unchanged heart size and mediastinal contours with aortic tortuosity. Bibasilar atelectasis. No evidence of pulmonary edema, large pleural effusion or pneumothorax. Scattered peripheral fibrosis. IMPRESSION: 1. Endotracheal tube tip slightly low in positioning 16 mm from the carina, consider retraction of 1-2 cm. Enteric tube in place. 2. Low  lung volumes with bibasilar atelectasis. Electronically Signed   By: Narda Rutherford M.D.   On: 08/26/2018 00:50   Mr Shirlee Latch ZO Contrast  Result Date: 08/26/2018 CLINICAL DATA:  LEFT brain infarct status post tPA with resultant intracranial hemorrhage. EXAM: MRI HEAD WITHOUT CONTRAST MRA HEAD WITHOUT CONTRAST TECHNIQUE: Multiplanar, multiecho pulse sequences of the brain and surrounding structures were obtained without intravenous contrast. Angiographic images of the head were obtained using MRA technique without contrast. COMPARISON:  Prior CT exams, most recent earlier today at 1:33 a.m. Most recent prior MR 02/10/2018. FINDINGS: MRI HEAD FINDINGS Brain: Apparent increase in size and number of multiple intracranial hemorrhages of an acute nature. Largest index lesion, measured on susceptibility weighting series 10, is 2 x 4 cm, increased from 1.5 x 3 cm earlier. Similar increase in medial RIGHT occipital lesion now 2 cm diameter, previous less than 1.5 cm. There is a posterior predominance of lesions, in the RIGHT greater than LEFT occipital and posterior parietal lobe, RIGHT greater than LEFT temporal lobe, raising the question of PRES. Numerous punctate foci of hemosiderin deposition were present on the previous MR from  September 2019, and the imaging findings could represent manifestation of underlying hypertensive cerebrovascular disease versus cerebral amyloid angiopathy, complicated by post tPA hemorrhage. Baseline atrophy and small vessel disease. No extra-axial collections. Vascular: Reported separately. Skull and upper cervical spine: Normal marrow signal. Sinuses/Orbits: Noncontributory Other: None MRA HEAD FINDINGS The internal carotid arteries are widely patent. The basilar artery is widely patent with vertebrals codominant. Fetal RIGHT PCA. No proximal intracranial stenosis or saccular aneurysm. No visible changes of vasculitis or vasospasm. IMPRESSION: Increase in both size and number of  multiple intracranial hemorrhages of an acute nature. Considerations include hypertensive cerebrovascular disease, cerebral amyloid angiopathy, complication of tPA, or hemorrhagic PRES. MRA of the intracranial circulation demonstrates no flow-limiting stenosis, large vessel vasculitis, vasospasm, or other significant finding. These results were called by telephone at the time of interpretation on 08/26/2018 at 3:25 pm to Dr. Roda ShuttersXu , who verbally acknowledged these results. Electronically Signed   By: Elsie StainJohn T Curnes M.D.   On: 08/26/2018 15:32   Ct Head Code Stroke Wo Contrast  Result Date: 2018-10-19 CLINICAL DATA:  Code stroke. Right-sided weakness. Last seen normal 21:30. EXAM: CT HEAD WITHOUT CONTRAST TECHNIQUE: Contiguous axial images were obtained from the base of the skull through the vertex without intravenous contrast. COMPARISON:  02/10/2018 FINDINGS: Brain: There is no mass, hemorrhage or extra-axial collection. The size and configuration of the ventricles and extra-axial CSF spaces are normal. There is hypoattenuation of the periventricular white matter, most commonly indicating chronic ischemic microangiopathy. Vascular: No abnormal hyperdensity of the major intracranial arteries or dural venous sinuses. No intracranial atherosclerosis. Skull: The visualized skull base, calvarium and extracranial soft tissues are normal. Sinuses/Orbits: No fluid levels or advanced mucosal thickening of the visualized paranasal sinuses. No mastoid or middle ear effusion. The orbits are normal. ASPECTS [facility] Stroke Program Early CT Score) - Ganglionic level infarction (caudate, lentiform nuclei, internal capsule, insula, M1-M3 cortex): 7 - Supraganglionic infarction (M4-M6 cortex): 3 Total score (0-10 with 10 being normal): 10 IMPRESSION: 1. No acute intracranial abnormality. 2. ASPECTS is 10. * These results were communicated to Dr. Georgiana SpinnerSushanth Aroor at 10:31 pm on 2018-10-19 by text page via the [facility] messaging system.  Electronically Signed   By: Deatra RobinsonKevin  Herman M.D.   On: 02020-05-17 22:32   [facility]  Result Date: 08/26/2018 Carotid Arterial Duplex Study Indications: CVA. Limitations: Ventilator, Patient immobility, patient positioning Performing Technologist: Chanda BusingGregory Collins RVT  Examination Guidelines: A complete evaluation includes B-mode imaging, spectral Doppler, color Doppler, and power Doppler as needed of all accessible portions of each vessel. Bilateral testing is considered an integral part of a complete examination. Limited examinations for reoccurring indications may be performed as noted.  Right Carotid Findings: +----------+--------+--------+--------+---------------------+------------------+           PSV cm/sEDV cm/sStenosisDescribe             Comments           +----------+--------+--------+--------+---------------------+------------------+ CCA Prox  141     0               smooth and                                                                heterogenous                            +----------+--------+--------+--------+---------------------+------------------+  CCA [id number]     8               smooth and                                                                heterogenous                            +----------+--------+--------+--------+---------------------+------------------+ ICA Prox  82      7                                    High resistant                                                            flow               +----------+--------+--------+--------+---------------------+------------------+ ICA Distal75      9                                    High resistant                                                            flow               +----------+--------+--------+--------+---------------------+------------------+ ECA       71      0                                                        +----------+--------+--------+--------+---------------------+------------------+ +----------+--------+-------+--------+-------------------+           PSV cm/sEDV cmsDescribeArm Pressure (mmHG) +----------+--------+-------+--------+-------------------+ [id number]                                        +----------+--------+-------+--------+-------------------+ +---------+--------+--+--------+-+--------------+ VertebralPSV cm/s65EDV cm/s6High resistant +---------+--------+--+--------+-+--------------+  Left Carotid Findings: +----------+--------+--------+--------+---------------------+------------------+           PSV cm/sEDV cm/sStenosisDescribe             Comments           +----------+--------+--------+--------+---------------------+------------------+ CCA Prox  139     0                                    tortuous           +----------+--------+--------+--------+---------------------+------------------+ CCA Distal144     11  smooth and                                                                heterogenous                            +----------+--------+--------+--------+---------------------+------------------+ ICA Prox  93      7               smooth and           High resistant                                       heterogenous         flow               +----------+--------+--------+--------+---------------------+------------------+ ICA Distal124     8                                    High resistant                                                            flow               +----------+--------+--------+--------+---------------------+------------------+ ECA       91      0                                                       +----------+--------+--------+--------+---------------------+------------------+ +----------+--------+--------+--------+-------------------+ SubclavianPSV cm/sEDV cm/sDescribeArm  Pressure (mmHG) +----------+--------+--------+--------+-------------------+           278                                         +----------+--------+--------+--------+-------------------+ +---------+--------+--+--------+-+--------------+ VertebralPSV cm/s96EDV cm/s7High resistant +---------+--------+--+--------+-+--------------+  Summary: Right Carotid: Velocities in the right ICA are consistent with a 1-39% stenosis.                High resistant waveforms are noted in the proximal and distal ICA                and the vertebral artery. Left Carotid: Velocities in the left ICA are consistent with a 1-39% stenosis.               High resistant waveforms are noted in the proximal and distal ICA               and the vertebral artery.  *See table(s) above for measurements and observations.     Preliminary    Dg Chest Port 1 View 08/28/2018 Slightly improved lung volume. No change in bibasilar atelectasis/pneumonia.  2D Echocardiogram  1. The left ventricle has hyperdynamic systolic function, with an ejection fraction of >65%. The cavity size was normal. There is mildly increased left ventricular wall thickness. Left ventricular diastolic Doppler parameters are consistent with  impaired relaxation. Indeterminate filling pressures The E/e' is 8-15. No evidence of left ventricular regional wall motion abnormalities.  2. The right ventricle has normal systolic function. The cavity was normal. There is no increase in right ventricular wall thickness.  3. The mitral valve is grossly normal.  4. The tricuspid valve is grossly normal.  5. The aortic valve is tricuspid Mild sclerosis of the aortic valve.  6. The inferior vena cava was normal in size with <50% respiratory variability.   PHYSICAL EXAM Temp:  [98.8 F (37.1 C)-100.8 F (38.2 C)] 100.8 F (38.2 C) (03/26 0803) Pulse Rate:  [67-108] 84 (03/26 0803) Resp:  [0-28] 16 (03/26 0803) BP: (101-153)/(34-99) 130/59 (03/26  0700) SpO2:  [95 %-100 %] 99 % (03/26 0803) Arterial Line BP: (84-170)/(39-72) 144/40 (03/26 0700) FiO2 (%):  [40 %] 40 % (03/26 0803) Weight:  [90.8 kg] 90.8 kg (03/26 0500)  General - Well nourished, well developed, intubated on sedation.  Ophthalmologic - fundi not visualized due to noncooperation.  Cardiovascular - Regular rate and rhythm.  Neuro - intubated on sedation, eyes closed, not following commands. With forced eye opening, eyes in left gaze position, not blinking to visual threat, doll's eyes sluggish, not tracking, PERRL. Corneal reflex weakly present bilaterally, gag and cough present. Breathing over the vent.  Facial symmetry not able to test due to ET tube.  Tongue midline in mouth. On pain stimulation, brisk movement on the left side, 3+/5 LUE and 2/5 RUE, as well as 2+/5 LLE and and 2/5 RLE. DTR diminished and no babinski. Sensation, coordination and gait not tested.   ASSESSMENT/PLAN Ms. Nelva Hoar is a 78 y.o. female with history of DB, HTN, COPD presenting with R sided weakness, facial droop and slurred speech. BP 230 en route. CT neg. Received tPA 08/05/2018 at 2238. HA, mental status change and fluctuating BP post tPA. CT showed SAH and ICH. Reversed w/ TXA.   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 ASPECTS 10.   CT head 3/24 0006 multifocal B IPH and SAH. No mass effect  CT head  3/24 0144 worsening ICH w/ new R temporal hmg 25mL. slightly increased size of other small ICH with unchanged SAH. No shift.  MRI 3/24 1458 stable in size and number of mult IC hmgs.  MRA  Unremarkable   CT head 3/24 2113 unchanged size, distribution hmgs  Carotid Doppler B ICA 1-39% stenosis, VAs antegrade   2D Echo  EF >  65%. No source of embolus   LDL 102  HgbA1c 10.4  Fibrinogen 336 s/p TXA  SCDs for VTE prophylaxis  No antithrombotic prior to admission, now on No antithrombotic given post tPA hemorrhage   Started on Keppra 500 q 12h for seizure prophylaxis - will continue for total 7 days - end date adjusted under orders  Therapy recommendations:  pending   Disposition:  pending   Code status - DNR - had long discussion with daughter and she requested continue current care but stated that pt would not want PEG  or Trach   Hypertensive Emergency  BP 230 per EMS  Lowered w. labetalol for tPA administration   Required cleviprex post tPA for fluctuating BP control  Remains on cleviprex, taper off if able  SBP goal < 160 (adjusted all med orders)  Add po BP meds with amlodipine 10, coreg 6.25 bid  Increase lisinopril to 20mg  bid  Close monitoring  Acute Reparatory Failure  Unable to protect airway following ICH after tPA  Intubated in ED  On low dose propofol   CCM on board  Family stated that pt would not want PEG or Trach  Fever  Tmax 100.6->100.8  CXR small b/l pleural effusion and atelectasis b/l basilar   Repeat CXR slightly improved lung volume. No change in bibasilar atelectasis/pneumonia.   CCM on board  Ice pack and tylenol PRN  Close monitoring  Dysaphgia  Secondary to stroke  NPO  On tube feeding @ 20  Resume some home meds via OG  SLP signed off d/t continued intubation, reorder when needed  Hyperlipidemia  Home meds:  No statin  LDL 102, goal < 70  Hold statin given hemorrhage  Consider statin at discharge based on plan of care  Diabetes type II  HgbA1c 10.4, goal < 7.0  Uncontrolled  Home DM Meds: Humalog 75/25 Insulin- 28 units AM/ 22 units PM  On lantus 10 bid   novolog 3u q 4h as per DB RN recommends for TF coverage  CBG monitoring   Mod SSI  CCM on board  Other Stroke Risk Factors  Advanced age  Obesity, Body  mass index is 35.46 kg/m., recommend weight loss, diet and exercise as appropriate   Other Active Problems  AKI, resolved Cr 1.2>0.70  COPD  Anemia, Hgb 13.9>11.2>10>9.9  Hypokalemia, resolved 4.1>3.3>3.2>4.0  Hospital day # 3  This patient is critically ill due to stroke, hemorrhage s/p tPA, hyperglycemia, hypertension, respiratory failure and at significant risk of neurological worsening, death form hematoma expansion, recurrent stroke, cerebral edema, seizure, brain herniation. This patient's care requires constant monitoring of vital signs, hemodynamics, respiratory and cardiac monitoring, review of multiple databases, neurological assessment, discussion with family, other specialists and medical decision making of high complexity. I spent 40 minutes of neurocritical care time in the care of this patient. Discussed with Dr. Drucie Ip.    Marvel Plan, MD PhD Stroke Neurology 08/28/2018 8:33 AM   To contact Stroke Continuity provider, please refer to [email or web address]. After hours, contact General Neurology

## 2018-08-28 NOTE — Progress Notes (Signed)
OT Cancellation Note  Patient Details Name: Beth Pope MRN: [id number] DOB: 1941-05-05   Cancelled Treatment:    Reason Eval/Treat Not Completed: Patient not medically ready; spoke with PT who discussed pt case with MD; MD asks that therapies hold today and check back tomorrow. Will continue to follow for medical readiness to participate.  Beth Pope, OT Supplemental Rehabilitation Services Pager [phone] Office [phone]    Orlando Penner 08/28/2018, 9:45 AM

## 2018-08-28 NOTE — Progress Notes (Signed)
Initial Nutrition Assessment RD working remotely.  DOCUMENTATION CODES:   Obesity unspecified  INTERVENTION:   Continue via OG tube Vital High Protein @ 20 ml/hr 30 ml Prostat five times per day MVI daily  Provides: 980 kcal, 117 grams protein, and 401 ml free water.  TF regimen with cleviprex at current rate providing 1384 total kcal/day.   Once appropriate pt would likely benefit from outpatient education.   NUTRITION DIAGNOSIS:   Inadequate oral intake related to inability to eat as evidenced by NPO status. Ongoing  GOAL:   Provide needs based on ASPEN/SCCM guidelines Progressing  MONITOR:   TF tolerance, I & O's  REASON FOR ASSESSMENT:   Consult Enteral/tube feeding initiation and management  ASSESSMENT:   Pt with PMH of HTN, COPD, and DM who was admitted 3/24 for a stroke she received tPA and then developed an ICH.    Pt intubated following stroke and developing ICH after tPA. Cleviprex is being weaned and propofol has weaned down and was off this am. Per MD mental status preventing extubation.  Blood sugars still elevated, DM coordinator following for recommendations.   Patient is currently intubated on ventilator support MV: 6.5 L/min Temp (24hrs), Avg:99.7 F (37.6 C), Min:98.8 F (37.1 C), Max:100.8 F (38.2 C) MAP: 71 Pt is positive 3 L  Cleviprex @ 8 ml/hr provides: 384 kcal  Medications reviewed and include: 12 units lantus BID, SSI, 3 units novolog every 4 hours, MVI Labs reviewed: CBG (last 3)  Recent Labs    08/28/18 0332 08/28/18 0826 08/28/18 1131  GLUCAP 208* 186* 272*    Lab Results  Component Value Date   HGBA1C 10.4 (H) 08/26/2018      NUTRITION - FOCUSED PHYSICAL EXAM:  Deferred   Diet Order:   Diet Order            Diet NPO time specified  Diet effective now              EDUCATION NEEDS:   No education needs have been identified at this time  Skin:  Skin Assessment: Reviewed RN Assessment  Last BM:   unknown  Height:   Ht Readings from Last 1 Encounters:  2018-09-17 5\' 3"  (1.6 m)    Weight:   Wt Readings from Last 1 Encounters:  08/28/18 90.8 kg    Ideal Body Weight:  52.2 kg  BMI:  Body mass index is 35.46 kg/m.  Estimated Nutritional Needs:   Kcal:  1000-1300  Protein:  >104 grams  Fluid:  > 1.5L/day  Kendell Bane RD, LDN, CNSC [phone] Pager [phone] After Hours Pager

## 2018-08-28 NOTE — Progress Notes (Signed)
PT Cancellation Note  Patient Details Name: Beth Pope MRN: [id number] DOB: 12/27/1940   Cancelled Treatment:    Reason Eval/Treat Not Completed: Patient not medically ready. Discussed pt case with MD who asks that therapies hold today and check back tomorrow. Will continue to follow for medical readiness to participate.    Marylynn Pearson 08/28/2018, 9:34 AM   Conni Slipper, PT, DPT Acute Rehabilitation Services Pager: [phone] Office: [phone]

## 2018-08-28 NOTE — Progress Notes (Signed)
Speech Language Pathology Discharge Patient Details Name: Beth Pope MRN: [id number] DOB: 04-27-1941 Today's Date: 08/28/2018 Time:  -     Patient discharged from SLP services secondary to medical decline - will need to re-order SLP to resume therapy services. Continues to need mechanical ventilation  Please see latest therapy progress note for current level of functioning and progress toward goals.    Progress and discharge plan discussed with patient and/or caregiver: Patient unable to participate in discharge planning and no caregivers available  GO     Royce Macadamia 08/28/2018, 8:25 AM   Breck Coons Lonell Face.Ed Nurse, children's [phone] Office [phone]

## 2018-08-28 NOTE — Progress Notes (Addendum)
NAME:  Beth Pope, MRN:  [id number], DOB:  12-13-1940, LOS: 3 ADMISSION DATE:  08/22/2018, CONSULTATION DATE:  08/26/18 REFERRING MD:  A[place]or  CHIEF COMPLAINT:  AMS   Brief History   Beth Pope is a 78 y.o. female who was admitted 3/23 with concern for CVA (NIH 6).  She received tPA and later had ICH.  She required intubation and was then transferred to the neuro ICU. She received 1g of TXA and was then transferred to the neuro ICU and PCCM was asked to see in consultation.  Past Medical History  HTN, COPD, DM.  Significant Hospital Events   3/23 Admit. 3/24 Intubated after ICH following tPA. 3/26 Remains on vent, PSV wean 10/5  Consults:  PCCM.  Procedures:  ETT 3/24 >> L radial a line 3/24 >>   Significant Diagnostic Tests:  CT head 3/23 > negative. CT head 3/24 > multifocal IPH and SAH. CT head 3/24 > worsening ICH with new IPH.  Slightly increased size of multiple other small foci of hemorrhage. MRI/MRA brain 3/24 > increase in both size and number of multiple intracranial hemorrhages of an acute nature, MRA with no flow limiting stenosis or acute findings  Echo 3/24 > LVEF 65-70%, mild LVH, grade 1 DD  Micro Data:  Tracheal Aspirate 3/24 >>   Antimicrobials:     Interim history/subjective:  Pt remains on cleviprex at 4, low dose propofol at 10.  Weaning on PSV but mental status unchanged.  Tmax 100.8.   Objective:  Blood pressure (!) 147/50, pulse 84, temperature (!) 100.8 F (38.2 C), temperature source Axillary, resp. rate 16, height 5\' 3"  (1.6 m), weight 90.8 kg, SpO2 99 %.    Vent Mode: PSV;CPAP FiO2 (%):  [40 %] 40 % Set Rate:  [16 bmp] 16 bmp Vt Set:  [420 mL] 420 mL PEEP:  [5 cmH20] 5 cmH20 Pressure Support:  [10 cmH20] 10 cmH20 Plateau Pressure:  [15 cmH20-21 cmH20] 15 cmH20   Intake/Output Summary (Last 24 hours) at 08/28/2018 1001 Last data filed at 08/28/2018 0900 Gross per 24 hour  Intake 1589.66 ml  Output 1000 ml  Net 589.66 ml    Filed Weights   08/19/2018 2200 08/28/18 0500  Weight: 88.6 kg 90.8 kg    Examination: General: elderly female lying in bed on vent, ill appearing but no acute distress HEENT: MM pink/moist, ETT Neuro: eyes closed, no follow commands, moves LLE with stimulation CV: s1s2 rrr, no m/r/g PULM: even/non-labored, lungs bilaterally clear  KM:QKMM, bsx4 active  Extremities: warm/dry, no edema  Skin: no rashes or lesions  Assessment & Plan:   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  S/p 1g TXA P: Per Neurology, appreciate input Follow serial neuro exams Continue Keppra   Acute Respiratory Insufficiency  -due to inability to protect the airway in the setting of CVA / ICH. P: PRVC 8cc/kg  Titrate O2 for sats >90% SBT as tolerated, mental status barrier for extubation currently  Follow intermittent CXR   Hypertensive Emergency P: Wean cleviprex to off  SBP goal <140 per Neurology  PRN hydralazine  Continue norvasc, coreg, lisinopril   AKI -in setting of hypertensive emergency  Hypokalemia  P: Trend BMP / urinary output Replace electrolytes as indicated Avoid nephrotoxic agents, ensure adequate renal perfusion Reduce NS to 50 ml/hr  Anemia  Thrombocytopenia  P: Trend CBC  Transfuse per ICU guidelines  Monitor for bleeding    DM P: SSI  Increase lantus  to 12 units BID   At Risk Malnutrition  P: TF per Nutrition   Best Practice:  Diet: NPO / TF    Pain/Anxiety/Delirium protocol (if indicated):in place  VAP protocol (if indicated): In place. DVT prophylaxis: SCD's. GI prophylaxis: PPI. Glucose control: SSI. Mobility: Bedrest. Code Status: Full. Family Communication: No family at bedside due to COVID restrictions. Disposition: ICU.  Labs   CBC: Recent Labs  Lab 08/29/2018 2217 08/26/18 0156 08/27/18 0401 08/27/18 0432 08/28/18 0411  WBC 5.9  --   --  11.7* 13.7*  NEUTROABS 2.8  --   --   --   --   HGB 12.0 13.9 11.2*  10.0* 9.9*  HCT 41.2 41.0 33.0* 32.7* 32.3*  MCV 71.9*  --   --  71.2* 71.6*  PLT 160  --   --  137* 136*   Basic Metabolic Panel: Recent Labs  Lab 08/07/2018 2217 08/09/2018 2223 08/26/18 0156 08/26/18 1710 08/27/18 0401 08/27/18 0432 08/27/18 1700 08/28/18 0411  NA 135  --  138  --  139 138  --  137  K 5.0  --  4.1  --  3.3* 3.2*  --  4.0  CL 99  --   --   --   --  106  --  107  CO2 28  --   --   --   --  24  --  22  GLUCOSE 331*  --   --   --   --  171*  --  221*  BUN 8  --   --   --   --  13  --  21  CREATININE 1.24* 1.20*  --   --   --  0.75  --  0.70  CALCIUM 8.9  --   --   --   --  8.5*  --  8.9  MG  --   --   --  1.7  --  1.8 1.8 1.9  PHOS  --   --   --  3.2  --  3.5 3.0 3.4   GFR: Estimated Creatinine Clearance: 60 mL/min (by C-G formula based on SCr of 0.7 mg/dL). Recent Labs  Lab 08/30/2018 2217 08/27/18 0432 08/28/18 0411  WBC 5.9 11.7* 13.7*   Liver Function Tests: Recent Labs  Lab 08/31/2018 2217 08/28/18 0411  AST 27 15  ALT 13 9  ALKPHOS 93 54  BILITOT 1.0 0.6  PROT 7.0 5.4*  ALBUMIN 3.8 2.6*   No results for input(s): LIPASE, AMYLASE in the last 168 hours. No results for input(s): AMMONIA in the last 168 hours. ABG    Component Value Date/Time   PHART 7.384 08/27/2018 0401   PCO2ART 44.1 08/27/2018 0401   PO2ART 188.0 (H) 08/27/2018 0401   HCO3 26.3 08/27/2018 0401   TCO2 28 08/27/2018 0401   O2SAT 100.0 08/27/2018 0401    Coagulation Profile: Recent Labs  Lab 08/06/2018 2300  INR 1.0   Cardiac Enzymes: No results for input(s): CKTOTAL, CKMB, CKMBINDEX, TROPONINI in the last 168 hours. HbA1C: Hgb A1c MFr Bld  Date/Time Value Ref Range Status  08/26/2018 05:44 AM 10.4 (H) 4.8 - 5.6 % Final    Comment:    (NOTE) Pre diabetes:          5.7%-6.4% Diabetes:              >6.4% Glycemic control for   <7.0% adults with diabetes    CBG: Recent Labs  Lab 08/27/18 1545  08/27/18 1940 08/27/18 2326 08/28/18 0332 08/28/18 0826  GLUCAP  254* 230* 118* 208* 186*      Critical care time:  30 minutes    Canary Brim, NP-C [facility] Pgr: [phone] or if no answer [phone] 08/28/2018, 10:01 AM

## 2018-08-29 LAB — BASIC METABOLIC PANEL
Anion gap: 6 (ref 5–15)
BUN: 23 mg/dL (ref 8–23)
CO2: 23 mmol/L (ref 22–32)
CREATININE: 0.7 mg/dL (ref 0.44–1.00)
Calcium: 8.9 mg/dL (ref 8.9–10.3)
Chloride: 110 mmol/L (ref 98–111)
GFR calc Af Amer: >60 mL/min (ref >60)
GFR calc non Af Amer: >60 mL/min (ref >60)
Glucose, Bld: 158 mg/dL — ABNORMAL HIGH (ref 70–99)
Potassium: 3.8 mmol/L (ref 3.5–5.1)
Sodium: 139 mmol/L (ref 135–145)

## 2018-08-29 LAB — CBC
HCT: 30.7 % — ABNORMAL LOW (ref 36.0–46.0)
Hemoglobin: 9.5 g/dL — ABNORMAL LOW (ref 12.0–15.0)
MCH: 21.9 pg — ABNORMAL LOW (ref 26.0–34.0)
MCHC: 30.9 g/dL (ref 30.0–36.0)
MCV: 70.7 fL — ABNORMAL LOW (ref 80.0–100.0)
Platelets: 136 10*3/uL — ABNORMAL LOW (ref 150–400)
RBC: 4.34 MIL/uL (ref 3.87–5.11)
RDW: 14.4 % (ref 11.5–15.5)
WBC: 16.3 10*3/uL — ABNORMAL HIGH (ref 4.0–10.5)
nRBC: 0 % (ref 0.0–0.2)

## 2018-08-29 LAB — GLUCOSE, CAPILLARY
GLUCOSE-CAPILLARY: 110 mg/dL — AB (ref 70–99)
Glucose-Capillary: 131 mg/dL — ABNORMAL HIGH (ref 70–99)
Glucose-Capillary: 131 mg/dL — ABNORMAL HIGH (ref 70–99)
Glucose-Capillary: 153 mg/dL — ABNORMAL HIGH (ref 70–99)
Glucose-Capillary: 160 mg/dL — ABNORMAL HIGH (ref 70–99)

## 2018-08-29 MED ORDER — LISINOPRIL 20 MG PO TABS
20.0000 mg | ORAL_TABLET | Freq: Two times a day (BID) | ORAL | Status: DC
  Administered 2018-08-29 – 2018-09-02 (×10): 20 mg
  Filled 2018-08-29 (×10): qty 1

## 2018-08-29 MED ORDER — CARVEDILOL 3.125 MG PO TABS
6.2500 mg | ORAL_TABLET | Freq: Two times a day (BID) | ORAL | Status: DC
  Administered 2018-08-29 – 2018-08-30 (×3): 6.25 mg
  Filled 2018-08-29 (×3): qty 2

## 2018-08-29 MED ORDER — AMLODIPINE BESYLATE 10 MG PO TABS
10.0000 mg | ORAL_TABLET | Freq: Every day | ORAL | Status: DC
  Administered 2018-08-29 – 2018-09-02 (×5): 10 mg
  Filled 2018-08-29 (×5): qty 1

## 2018-08-29 NOTE — Progress Notes (Signed)
OT Cancellation Note  Patient Details Name: Kaielle Resta MRN: [id number] DOB: 1941-05-23   Cancelled Treatment:    Reason Eval/Treat Not Completed: Active bedrest order. Pt continues on bedrest, have let RN know via chat text to let us know if pt comes off of bedrest today.  Ignacia Palma, OTR/L Acute Rehab Services Pager [phone] Office [phone]     [name] 08/29/2018, 7:41 AM

## 2018-08-29 NOTE — Progress Notes (Signed)
STROKE TEAM PROGRESS NOTE   INTERVAL HISTORY Patient still intubated, off sedation.  Eyes open on voice, not following commands.  Moving all extremities on pain stimulation, left more than right.  Able to talk to her daughter and give her update via phone  Vitals:   08/29/18 0744 08/29/18 0800 08/29/18 0815 08/29/18 0819  BP:  (!) 164/53 (!) 117/25 (!) 134/30  Pulse: 88 86 92 83  Resp: 16 (!) 22 (!) 22 19  Temp:  99.2 F (37.3 C)    TempSrc:  Axillary    SpO2: 100% 100% 100% 99%  Weight:      Height:       CBG (last 3)  Recent Labs    08/28/18 2314 08/29/18 0333 08/29/18 0738  GLUCAP 185* 153* 110*    CBC:  Recent Labs  Lab 09/02/2018 2217  08/28/18 0411 08/29/18 0336  WBC 5.9   < > 13.7* 16.3*  NEUTROABS 2.8  --   --   --   HGB 12.0   < > 9.9* 9.5*  HCT 41.2   < > 32.3* 30.7*  MCV 71.9*   < > 71.6* 70.7*  PLT 160   < > 136* 136*   < > = values in this interval not displayed.    Basic Metabolic Panel:  Recent Labs  Lab 08/27/18 1700 08/28/18 0411 08/29/18 0336  NA  --  137 139  K  --  4.0 3.8  CL  --  107 110  CO2  --  22 23  GLUCOSE  --  221* 158*  BUN  --  21 23  CREATININE  --  0.70 0.70  CALCIUM  --  8.9 8.9  MG 1.8 1.9  --   PHOS 3.0 3.4  --    Lipid Panel:     Component Value Date/Time   CHOL 178 08/26/2018 0500   TRIG 191 (H) 08/26/2018 0500   HDL 38 (L) 08/26/2018 0500   CHOLHDL 4.7 08/26/2018 0500   VLDL 38 08/26/2018 0500   LDLCALC 102 (H) 08/26/2018 0500   HgbA1c:  Lab Results  Component Value Date   HGBA1C 10.4 (H) 08/26/2018   Urine Drug Screen: No results found for: LABOPIA, COCAINSCRNUR, LABBENZ, AMPHETMU, THCU, LABBARB  Alcohol Level     Component Value Date/Time   ETH <10 08/15/2018 2225    IMAGING Ct Head Wo Contrast  Result Date: 08/26/2018 CLINICAL DATA:  Status post tPA with intracranial hemorrhage. EXAM: CT HEAD WITHOUT CONTRAST TECHNIQUE: Contiguous axial images were obtained from the base of the skull through the  vertex without intravenous contrast. COMPARISON:  Head CT 08/26/2018 FINDINGS: Brain: Allowing for the a small amount of redistribution, the size and distribution of hemorrhagic sites within the brain are unchanged. There is no midline shift or other mass effect. Size and configuration of the ventricles are unchanged. Vascular: No abnormal hyperdensity of the major intracranial arteries or dural venous sinuses. No intracranial atherosclerosis. Skull: The visualized skull base, calvarium and extracranial soft tissues are normal. Sinuses/Orbits: No fluid levels or advanced mucosal thickening of the visualized paranasal sinuses. No mastoid or middle ear effusion. The orbits are normal. IMPRESSION: Unchanged size and distribution of hemorrhagic foci within the brain. Electronically Signed   By: Deatra Robinson M.D.   On: 08/26/2018 23:31   Ct Head Wo Contrast  Result Date: 08/26/2018 CLINICAL DATA:  Intracranial hemorrhage follow up, status post tPA EXAM: CT HEAD WITHOUT CONTRAST TECHNIQUE: Contiguous axial images were obtained from  the base of the skull through the vertex without intravenous contrast. COMPARISON:  08/26/2018 FINDINGS: Brain: Intracranial hemorrhage has worsened since the prior scan. There is a new focus of intraparenchymal hemorrhage in the right temporal lobe that measures 3.3 x 1.5 x 1.6 cm (volume = 4.1 cm^3). Bilateral occipital lobe hemorrhages are slightly larger. Distribution of subarachnoid blood is unchanged. No midline shift or other mass effect. Vascular: No abnormal hyperdensity of the major intracranial arteries or dural venous sinuses. No intracranial atherosclerosis. Skull: The visualized skull base, calvarium and extracranial soft tissues are normal. Sinuses/Orbits: No fluid levels or advanced mucosal thickening of the visualized paranasal sinuses. No mastoid or middle ear effusion. The orbits are normal. IMPRESSION: 1. Worsening intracranial hemorrhage, most notably within the right  temporal lobe, where there is a new intraparenchymal hematoma that measures 4.1 mL. 2. Slightly increased size of multiple other small foci of hemorrhage with unchanged distribution of subarachnoid blood. 3. No midline shift or other mass effect. 4. Critical Value/emergent results were called by telephone at the time of interpretation on 08/26/2018 at 1:54 am to Dr. Georgiana Spinner Aroor, who verbally acknowledged these results. Electronically Signed   By: Deatra Robinson M.D.   On: 08/26/2018 01:55   Ct Head Wo Contrast  Addendum Date: 08/26/2018   ADDENDUM REPORT: 08/26/2018 00:37 ADDENDUM: Critical Value/emergent results were called by telephone at the time of interpretation on 08/26/2018 at 12:36 am to Dr. Georgiana Spinner Aroor , who verbally acknowledged these results. Electronically Signed   By: Deatra Robinson M.D.   On: 08/26/2018 00:37   Result Date: 08/26/2018 CLINICAL DATA:  Status post tPA EXAM: CT HEAD WITHOUT CONTRAST TECHNIQUE: Contiguous axial images were obtained from the base of the skull through the vertex without intravenous contrast. COMPARISON:  None. FINDINGS: Brain: There is multifocal subarachnoid and parenchymal hemorrhage within both hemispheres. Largest area hemorrhages in the left frontal lobe, measuring approximately 1.6 x 1.0 x 1.6 cm (volume = 1.3 cm^3). Smaller foci of hemorrhage are located within both occipital lobes. The subarachnoid blood is greatest over the right parietal convexity. No midline shift or other mass effect. There is periventricular hypoattenuation compatible with chronic microvascular disease. Vascular: No abnormal hyperdensity of the major intracranial arteries or dural venous sinuses. No intracranial atherosclerosis. Skull: The visualized skull base, calvarium and extracranial soft tissues are normal. Sinuses/Orbits: No fluid levels or advanced mucosal thickening of the visualized paranasal sinuses. No mastoid or middle ear effusion. The orbits are normal. IMPRESSION:  Multifocal acute intraparenchymal and subarachnoid hemorrhage involving both hemispheres, greatest in the left frontal lobe. No associated mass effect. Electronically Signed: By: Deatra Robinson M.D. On: 08/26/2018 00:28   Mr Brain Wo Contrast  Result Date: 08/26/2018 CLINICAL DATA:  LEFT brain infarct status post tPA with resultant intracranial hemorrhage. EXAM: MRI HEAD WITHOUT CONTRAST MRA HEAD WITHOUT CONTRAST TECHNIQUE: Multiplanar, multiecho pulse sequences of the brain and surrounding structures were obtained without intravenous contrast. Angiographic images of the head were obtained using MRA technique without contrast. COMPARISON:  Prior CT exams, most recent earlier today at 1:33 a.m. Most recent prior MR 02/10/2018. FINDINGS: MRI HEAD FINDINGS Brain: Apparent increase in size and number of multiple intracranial hemorrhages of an acute nature. Largest index lesion, measured on susceptibility weighting series 10, is 2 x 4 cm, increased from 1.5 x 3 cm earlier. Similar increase in medial RIGHT occipital lesion now 2 cm diameter, previous less than 1.5 cm. There is a posterior predominance of lesions, in the RIGHT greater than  LEFT occipital and posterior parietal lobe, RIGHT greater than LEFT temporal lobe, raising the question of PRES. Numerous punctate foci of hemosiderin deposition were present on the previous MR from September 2019, and the imaging findings could represent manifestation of underlying hypertensive cerebrovascular disease versus cerebral amyloid angiopathy, complicated by post tPA hemorrhage. Baseline atrophy and small vessel disease. No extra-axial collections. Vascular: Reported separately. Skull and upper cervical spine: Normal marrow signal. Sinuses/Orbits: Noncontributory Other: None MRA HEAD FINDINGS The internal carotid arteries are widely patent. The basilar artery is widely patent with vertebrals codominant. Fetal RIGHT PCA. No proximal intracranial stenosis or saccular  aneurysm. No visible changes of vasculitis or vasospasm. IMPRESSION: Increase in both size and number of multiple intracranial hemorrhages of an acute nature. Considerations include hypertensive cerebrovascular disease, cerebral amyloid angiopathy, complication of tPA, or hemorrhagic PRES. MRA of the intracranial circulation demonstrates no flow-limiting stenosis, large vessel vasculitis, vasospasm, or other significant finding. These results were called by telephone at the time of interpretation on 08/26/2018 at 3:25 pm to Dr. Roda Shutters , who verbally acknowledged these results. Electronically Signed   By: Elsie Stain M.D.   On: 08/26/2018 15:32   Dg Chest Port 1 View  Result Date: 08/27/2018 CLINICAL DATA:  Respiratory failure.  ET tube placement. EXAM: PORTABLE CHEST 1 VIEW COMPARISON:  08/26/2018 FINDINGS: Normal heart size. Aortic atherosclerosis. The endotracheal tube tip is 1.5 cm above the carina. There is an enteric 2 with tip projecting over the expected location of the body of stomach. Side port is below the level of the GE junction. Small bilateral pleural effusions and mild pulmonary vascular congestion. Atelectasis is noted in both lung bases. IMPRESSION: Small bilateral pleural effusions and bibasilar atelectasis. Electronically Signed   By: Signa Kell M.D.   On: 08/27/2018 08:02   Dg Chest Port 1 View  Result Date: 08/26/2018 CLINICAL DATA:  Intubation. EXAM: PORTABLE CHEST 1 VIEW COMPARISON:  02/14/2018 FINDINGS: Endotracheal tube tip slightly low in positioning 16 mm from the carina. Tip and side port of the enteric tube below the diaphragm in the stomach. Low lung volumes. Unchanged heart size and mediastinal contours with aortic tortuosity. Bibasilar atelectasis. No evidence of pulmonary edema, large pleural effusion or pneumothorax. Scattered peripheral fibrosis. IMPRESSION: 1. Endotracheal tube tip slightly low in positioning 16 mm from the carina, consider retraction of 1-2 cm. Enteric  tube in place. 2. Low lung volumes with bibasilar atelectasis. Electronically Signed   By: Narda Rutherford M.D.   On: 08/26/2018 00:50   Mr Shirlee Latch ZO Contrast  Result Date: 08/26/2018 CLINICAL DATA:  LEFT brain infarct status post tPA with resultant intracranial hemorrhage. EXAM: MRI HEAD WITHOUT CONTRAST MRA HEAD WITHOUT CONTRAST TECHNIQUE: Multiplanar, multiecho pulse sequences of the brain and surrounding structures were obtained without intravenous contrast. Angiographic images of the head were obtained using MRA technique without contrast. COMPARISON:  Prior CT exams, most recent earlier today at 1:33 a.m. Most recent prior MR 02/10/2018. FINDINGS: MRI HEAD FINDINGS Brain: Apparent increase in size and number of multiple intracranial hemorrhages of an acute nature. Largest index lesion, measured on susceptibility weighting series 10, is 2 x 4 cm, increased from 1.5 x 3 cm earlier. Similar increase in medial RIGHT occipital lesion now 2 cm diameter, previous less than 1.5 cm. There is a posterior predominance of lesions, in the RIGHT greater than LEFT occipital and posterior parietal lobe, RIGHT greater than LEFT temporal lobe, raising the question of PRES. Numerous punctate foci of hemosiderin deposition were  present on the previous MR from September 2019, and the imaging findings could represent manifestation of underlying hypertensive cerebrovascular disease versus cerebral amyloid angiopathy, complicated by post tPA hemorrhage. Baseline atrophy and small vessel disease. No extra-axial collections. Vascular: Reported separately. Skull and upper cervical spine: Normal marrow signal. Sinuses/Orbits: Noncontributory Other: None MRA HEAD FINDINGS The internal carotid arteries are widely patent. The basilar artery is widely patent with vertebrals codominant. Fetal RIGHT PCA. No proximal intracranial stenosis or saccular aneurysm. No visible changes of vasculitis or vasospasm. IMPRESSION: Increase in both  size and number of multiple intracranial hemorrhages of an acute nature. Considerations include hypertensive cerebrovascular disease, cerebral amyloid angiopathy, complication of tPA, or hemorrhagic PRES. MRA of the intracranial circulation demonstrates no flow-limiting stenosis, large vessel vasculitis, vasospasm, or other significant finding. These results were called by telephone at the time of interpretation on 08/26/2018 at 3:25 pm to Dr. Roda Shutters , who verbally acknowledged these results. Electronically Signed   By: Elsie Stain M.D.   On: 08/26/2018 15:32   Ct Head Code Stroke Wo Contrast  Result Date: 08/19/2018 CLINICAL DATA:  Code stroke. Right-sided weakness. Last seen normal 21:30. EXAM: CT HEAD WITHOUT CONTRAST TECHNIQUE: Contiguous axial images were obtained from the base of the skull through the vertex without intravenous contrast. COMPARISON:  02/10/2018 FINDINGS: Brain: There is no mass, hemorrhage or extra-axial collection. The size and configuration of the ventricles and extra-axial CSF spaces are normal. There is hypoattenuation of the periventricular white matter, most commonly indicating chronic ischemic microangiopathy. Vascular: No abnormal hyperdensity of the major intracranial arteries or dural venous sinuses. No intracranial atherosclerosis. Skull: The visualized skull base, calvarium and extracranial soft tissues are normal. Sinuses/Orbits: No fluid levels or advanced mucosal thickening of the visualized paranasal sinuses. No mastoid or middle ear effusion. The orbits are normal. ASPECTS [facility] Stroke Program Early CT Score) - Ganglionic level infarction (caudate, lentiform nuclei, internal capsule, insula, M1-M3 cortex): 7 - Supraganglionic infarction (M4-M6 cortex): 3 Total score (0-10 with 10 being normal): 10 IMPRESSION: 1. No acute intracranial abnormality. 2. ASPECTS is 10. * These results were communicated to Dr. Georgiana Spinner Aroor at 10:31 pm on 08/19/2018 by text page via the [facility]  messaging system. Electronically Signed   By: Deatra Robinson M.D.   On: 08/27/2018 22:32   Carotid (at [facility])  Result Date: 08/26/2018 Carotid Arterial Duplex Study Indications: CVA. Limitations: Ventilator, Patient immobility, patient positioning Performing Technologist: Chanda Busing RVT  Examination Guidelines: A complete evaluation includes B-mode imaging, spectral Doppler, color Doppler, and power Doppler as needed of all accessible portions of each vessel. Bilateral testing is considered an integral part of a complete examination. Limited examinations for reoccurring indications may be performed as noted.  Right Carotid Findings: +----------+--------+--------+--------+---------------------+------------------+           PSV cm/sEDV cm/sStenosisDescribe             Comments           +----------+--------+--------+--------+---------------------+------------------+ CCA Prox  141     0               smooth and                                                                heterogenous                            +----------+--------+--------+--------+---------------------+------------------+  CCA [id number]     8               smooth and                                                                heterogenous                            +----------+--------+--------+--------+---------------------+------------------+ ICA Prox  82      7                                    High resistant                                                            flow               +----------+--------+--------+--------+---------------------+------------------+ ICA Distal75      9                                    High resistant                                                            flow               +----------+--------+--------+--------+---------------------+------------------+ ECA       71      0                                                        +----------+--------+--------+--------+---------------------+------------------+ +----------+--------+-------+--------+-------------------+           PSV cm/sEDV cmsDescribeArm Pressure (mmHG) +----------+--------+-------+--------+-------------------+ [id number]                                        +----------+--------+-------+--------+-------------------+ +---------+--------+--+--------+-+--------------+ VertebralPSV cm/s65EDV cm/s6High resistant +---------+--------+--+--------+-+--------------+  Left Carotid Findings: +----------+--------+--------+--------+---------------------+------------------+           PSV cm/sEDV cm/sStenosisDescribe             Comments           +----------+--------+--------+--------+---------------------+------------------+ CCA Prox  139     0                                    tortuous           +----------+--------+--------+--------+---------------------+------------------+ CCA Distal144     11  smooth and                                                                heterogenous                            +----------+--------+--------+--------+---------------------+------------------+ ICA Prox  93      7               smooth and           High resistant                                       heterogenous         flow               +----------+--------+--------+--------+---------------------+------------------+ ICA Distal124     8                                    High resistant                                                            flow               +----------+--------+--------+--------+---------------------+------------------+ ECA       91      0                                                       +----------+--------+--------+--------+---------------------+------------------+ +----------+--------+--------+--------+-------------------+ SubclavianPSV cm/sEDV  cm/sDescribeArm Pressure (mmHG) +----------+--------+--------+--------+-------------------+           278                                         +----------+--------+--------+--------+-------------------+ +---------+--------+--+--------+-+--------------+ VertebralPSV cm/s96EDV cm/s7High resistant +---------+--------+--+--------+-+--------------+  Summary: Right Carotid: Velocities in the right ICA are consistent with a 1-39% stenosis.                High resistant waveforms are noted in the proximal and distal ICA                and the vertebral artery. Left Carotid: Velocities in the left ICA are consistent with a 1-39% stenosis.               High resistant waveforms are noted in the proximal and distal ICA               and the vertebral artery.  *See table(s) above for measurements and observations.     Preliminary    Dg Chest Port 1 View 08/28/2018 Slightly improved lung volume. No change in bibasilar atelectasis/pneumonia.    2D  Echocardiogram  1. The left ventricle has hyperdynamic systolic function, with an ejection fraction of >65%. The cavity size was normal. There is mildly increased left ventricular wall thickness. Left ventricular diastolic Doppler parameters are consistent with  impaired relaxation. Indeterminate filling pressures The E/e' is 8-15. No evidence of left ventricular regional wall motion abnormalities.  2. The right ventricle has normal systolic function. The cavity was normal. There is no increase in right ventricular wall thickness.  3. The mitral valve is grossly normal.  4. The tricuspid valve is grossly normal.  5. The aortic valve is tricuspid Mild sclerosis of the aortic valve.  6. The inferior vena cava was normal in size with <50% respiratory variability.   PHYSICAL EXAM Temp:  [98.9 F (37.2 C)-100 F (37.8 C)] 99.2 F (37.3 C) (03/27 0800) Pulse Rate:  [68-115] 83 (03/27 0819) Resp:  [12-26] 19 (03/27 0819) BP: (80-173)/(25-120) 134/30  (03/27 0819) SpO2:  [98 %-100 %] 99 % (03/27 0819) Arterial Line BP: (110-161)/(35-47) 161/46 (03/26 1500) FiO2 (%):  [40 %] 40 % (03/27 0744) Weight:  [90.3 kg] 90.3 kg (03/27 0428)  General - Well nourished, well developed, intubated off sedation.  Ophthalmologic - fundi not visualized due to noncooperation.  Cardiovascular - Regular rate and rhythm.  Neuro - intubated off sedation, eyes open on voice, not following commands.. With eye opening, eyes in left gaze position then moved to mid position, not blinking to visual threat, doll's eyes present, not tracking, PERRL. Corneal reflex present bilaterally, gag and cough present. Breathing over the vent.  Facial symmetry not able to test due to ET tube.  Tongue midline in mouth. On pain stimulation, brisk movement on the left side, 3+/5 LUE and 2/5 RUE, as well as 3/5 LLE and and 2/5 RLE. DTR diminished and no babinski. Sensation, coordination and gait not tested.   ASSESSMENT/PLAN Ms. Beth BickersFlorence Pope is a 78 y.o. female with history of DB, HTN, COPD presenting with R sided weakness, facial droop and slurred speech. BP 230 en route. CT neg. Received tPA 11-26-18 at 2238. HA, mental status change and fluctuating BP post tPA. CT showed SAH and ICH. Reversed w/ TXA.   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 ASPECTS 10.   CT head 3/24 0006 multifocal B IPH and SAH. No mass effect  CT head  3/24 0144 worsening ICH w/ new R temporal hmg 4mL. slightly increased size of other small ICH with unchanged SAH. No shift.  MRI 3/24 1458 stable in size and number of mult IC hmgs.  MRA  Unremarkable   CT head 3/24 2113 unchanged size, distribution hmgs  Carotid Doppler B ICA 1-39% stenosis, VAs  antegrade   2D Echo  EF > 65%. No source of embolus   LDL 102  HgbA1c 10.4  Fibrinogen 336 s/p TXA  SCDs for VTE prophylaxis  No antithrombotic prior to admission, now on No antithrombotic given post tPA hemorrhage   Started on Keppra 500 q 12h for seizure prophylaxis - will continue for total 7 days - end date adjusted under orders  Therapy recommendations:  pending   Disposition:  pending   Code status - DNR - had long discussion with daughter and she requested continue current care but stated that pt  would not want PEG or Trach   Hypertensive Emergency  BP 230 per EMS  Lowered w. labetalol for tPA administration   Required cleviprex post tPA for fluctuating BP control  Off Cleviprex now  SBP goal < 160 (adjusted all med orders)  On po BP meds with amlodipine 10, coreg 6.25 bid, lisinopril 20mg  bid  Close monitoring  Acute Reparatory Failure  Unable to protect airway following ICH after tPA  Intubated in ED  On low dose propofol   CCM on board  Mental status precludes extubation   Family stated that pt would not want PEG or Trach  Fever Leukocytosis  Tmax 100.8-> afebrile  CXR small b/l pleural effusion and atelectasis b/l basilar   Repeat CXR 3/26 slightly improved lung volume. No change in bibasilar atelectasis/pneumonia.   WBC 11.7>13.7>16.3  CCM on board  Ice pack and tylenol PRN  Close monitoring  Dysaphgia  Secondary to stroke  NPO  On tube feeding @ 20  Resume some home meds via OG  SLP signed off d/t continued intubation, reorder when needed  Hyperlipidemia  Home meds:  No statin  LDL 102, goal < 70  Hold statin given hemorrhage  Consider statin at discharge based on plan of care  Diabetes type II  HgbA1c 10.4, goal < 7.0  Uncontrolled  Home DM Meds: Humalog 75/25 Insulin- 28 units AM/ 22 units PM  On lantus 12 bid   novolog 3u q 4h as per DB RN recommends for TF coverage  CBG monitoring   Mod  SSI  CCM on board  Other Stroke Risk Factors  Advanced age  Obesity, Body mass index is 35.26 kg/m., recommend weight loss, diet and exercise as appropriate   Other Active Problems  AKI, resolved Cr 1.2>0.70  COPD  Anemia, Hgb 13.9>11.2>10>9.9>9.5  Hypokalemia, resolved 3.8  Thrombocytopenia, stable PLT 136   Hospital day # 4  This patient is critically ill due to stroke, hemorrhage s/p tPA, hyperglycemia, hypertension, respiratory failure and at significant risk of neurological worsening, death form hematoma expansion, recurrent stroke, cerebral edema, seizure, brain herniation. This patient's care requires constant monitoring of vital signs, hemodynamics, respiratory and cardiac monitoring, review of multiple databases, neurological assessment, discussion with family, other specialists and medical decision making of high complexity. I spent 40 minutes of neurocritical care time in the care of this patient. I had long discussion with daughter over the phone, updated pt current condition, treatment plan and potential prognosis. She expressed understanding and appreciation.     Marvel Plan, MD PhD Stroke Neurology 08/29/2018 10:18 AM   To contact Stroke Continuity provider, please refer to [email or web address]. After hours, contact General Neurology

## 2018-08-29 NOTE — Progress Notes (Signed)
NAME:  Beth Pope, MRN:  [id number], DOB:  1940/09/09, LOS: 4 ADMISSION DATE:  08/11/2018, CONSULTATION DATE:  08/26/18 REFERRING MD:  Aroor  CHIEF COMPLAINT:  AMS   Brief History   Beth Pope is a 78 y.o. female who was admitted 3/23 with concern for CVA (NIH 6).  She received tPA and later had ICH.  She required intubation and was then transferred to the neuro ICU. She received 1g of TXA and was then transferred to the neuro ICU and PCCM was asked to see in consultation.  Past Medical History  HTN, COPD, DM.  Significant Hospital Events   3/23 Admit. 3/24 Intubated after ICH following tPA. 3/26 Remains on vent, PSV wean 10/5 08/29/2018 not extubated at this time.  Consults:  PCCM.  Procedures:  ETT 3/24 >> L radial a line 3/24 >>   Significant Diagnostic Tests:  CT head 3/23 > negative. CT head 3/24 > multifocal IPH and SAH. CT head 3/24 > worsening ICH with new IPH.  Slightly increased size of multiple other small foci of hemorrhage. MRI/MRA brain 3/24 > increase in both size and number of multiple intracranial hemorrhages of an acute nature, MRA with no flow limiting stenosis or acute findings  Echo 3/24 > LVEF 65-70%, mild LVH, grade 1 DD  Micro Data:  Tracheal Aspirate 3/24 >>   Antimicrobials:     Interim history/subjective:  Off Cleviprex.  Not extubated at this time.  Objective:  Blood pressure (!) 134/30, pulse 83, temperature 99.5 F (37.5 C), temperature source Oral, resp. rate 19, height  (1.6 m), weight 90.3 kg, SpO2 99 %.    Vent Mode: PSV;CPAP FiO2 (%):  [40 %] 40 % Set Rate:  [16 bmp] 16 bmp Vt Set:  [420 mL] 420 mL PEEP:  [5 cmH20] 5 cmH20 Pressure Support:  [10 cmH20] 10 cmH20 Plateau Pressure:  [15 cmH20-18 cmH20] 15 cmH20   Intake/Output Summary (Last 24 hours) at 08/29/2018 0844 Last data filed at 08/29/2018 0600 Gross per 24 hour  Intake 1820.25 ml  Output 1000 ml  Net 820.25 ml   Filed Weights   08/29/2018 2200 08/28/18  0500 08/29/18 0428  Weight: 88.6 kg 90.8 kg 90.3 kg    Examination: General: Morbid obese female who is poorly responsive HEENT: Endotracheal tube and gastric tube in place Neuro: Does not follow commands CV: s1s2 rrr, no m/r/g PULM: even/non-labored, lungs bilaterally diminished throughout WU:XLKG, non-tender, bsx4 active  Extremities: warm/dry, 2+ edema  Skin: no rashes or lesions   Assessment & Plan:   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  S/p 1g TXA P: Per neurology Continue Keppra  Acute Respiratory Insufficiency  -due to inability to protect the airway in the setting of CVA / ICH. P: She remains on pressure regulated volume control 8 cc/kg   Her mental status precludes any attempted extubation Currently on pressure support of 10 which creates a tidal volume of 280 with a rate of 24  Hypertensive Emergency 08/29/2018 systolic blood pressure ranged from 128-164 P:  Cleviprex drip is off Systolic blood pressure goal less than 140 per neurology Continue PRN hydralazine Continue Norvasc, Coreg, lisinopril  AKI -in setting of hypertensive emergency  Lab Results  Component Value Date   CREATININE 0.70 08/29/2018   CREATININE 0.70 08/28/2018   CREATININE 0.75 08/27/2018   Recent Labs  Lab 08/27/18 0432 08/28/18 0411 08/29/18 0336  K 3.2* 4.0 3.8   Hypokalemia  P: Monitor BMP  continue IV fluids  Adjust IV fluids as needed with tube feedings in place   Anemia  Thrombocytopenia  Recent Labs    08/28/18 0411 08/29/18 0336  HGB 9.9* 9.5*  08/29/2018 platelets 136 no significant change  P: Transfuse per protocol  DM CBG (last 3)  Recent Labs    08/28/18 2314 08/29/18 0333 08/29/18 0738  GLUCAP 185* 153* 110*   P:  Sliding scale insulin plus Lantus 12 units twice daily  At Risk Malnutrition  P:  Feeding per nutrition  Best Practice:  Diet: NPO / TF    Pain/Anxiety/Delirium protocol (if indicated):  Currently not on sedation VAP protocol (if indicated): In place. DVT prophylaxis: SCD's. GI prophylaxis: PPI. Glucose control: SSI. Mobility: Bedrest. Code Status: Made DNR by neurology 08/27/2018 Family Communication: 08/29/2018 no family at bedside Disposition: ICU.  Labs   CBC: Recent Labs  Lab 08/10/2018 2217 08/26/18 0156 08/27/18 0401 08/27/18 0432 08/28/18 0411 08/29/18 0336  WBC 5.9  --   --  11.7* 13.7* 16.3*  NEUTROABS 2.8  --   --   --   --   --   HGB 12.0 13.9 11.2* 10.0* 9.9* 9.5*  HCT 41.2 41.0 33.0* 32.7* 32.3* 30.7*  MCV 71.9*  --   --  71.2* 71.6* 70.7*  PLT 160  --   --  137* 136* 136*   Basic Metabolic Panel: Recent Labs  Lab 08/06/2018 2217 08/24/2018 2223 08/26/18 0156 08/26/18 1710 08/27/18 0401 08/27/18 0432 08/27/18 1700 08/28/18 0411 08/29/18 0336  NA 135  --  138  --  139 138  --  137 139  K 5.0  --  4.1  --  3.3* 3.2*  --  4.0 3.8  CL 99  --   --   --   --  106  --  107 110  CO2 28  --   --   --   --  24  --  22 23  GLUCOSE 331*  --   --   --   --  171*  --  221* 158*  BUN 8  --   --   --   --  13  --  21 23  CREATININE 1.24* 1.20*  --   --   --  0.75  --  0.70 0.70  CALCIUM 8.9  --   --   --   --  8.5*  --  8.9 8.9  MG  --   --   --  1.7  --  1.8 1.8 1.9  --   PHOS  --   --   --  3.2  --  3.5 3.0 3.4  --    GFR: Estimated Creatinine Clearance: 59.9 mL/min (by C-G formula based on SCr of 0.7 mg/dL). Recent Labs  Lab 08/29/2018 2217 08/27/18 0432 08/28/18 0411 08/29/18 0336  WBC 5.9 11.7* 13.7* 16.3*   Liver Function Tests: Recent Labs  Lab 08/28/2018 2217 08/28/18 0411  AST 27 15  ALT 13 9  ALKPHOS 93 54  BILITOT 1.0 0.6  PROT 7.0 5.4*  ALBUMIN 3.8 2.6*   No results for input(s): LIPASE, AMYLASE in the last 168 hours. No results for input(s): AMMONIA in the last 168 hours. ABG    Component Value Date/Time   PHART 7.384 08/27/2018 0401   PCO2ART 44.1 08/27/2018 0401   PO2ART 188.0 (H) 08/27/2018 0401   HCO3 26.3 08/27/2018  0401   TCO2 28 08/27/2018 0401   O2SAT 100.0 08/27/2018  0401    Coagulation Profile: Recent Labs  Lab 08/31/2018 2300  INR 1.0   Cardiac Enzymes: No results for input(s): CKTOTAL, CKMB, CKMBINDEX, TROPONINI in the last 168 hours. HbA1C: Hgb A1c MFr Bld  Date/Time Value Ref Range Status  08/26/2018 05:44 AM 10.4 (H) 4.8 - 5.6 % Final    Comment:    (NOTE) Pre diabetes:          5.7%-6.4% Diabetes:              >6.4% Glycemic control for   <7.0% adults with diabetes    CBG: Recent Labs  Lab 08/28/18 1606 08/28/18 1930 08/28/18 2314 08/29/18 0333 08/29/18 0738  GLUCAP 119* 184* 185* 153* 110*      Critical care time:  30 minutes    Brett Canales Nalina Yeatman ACNP [facility] PCCM Pager [phone] till 1 pm If no answer page [phone] 08/29/2018, 8:44 AM

## 2018-08-29 NOTE — Progress Notes (Signed)
PT Cancellation Note  Patient Details Name: Beth Pope MRN: [id number] DOB: 1941-06-03   Cancelled Treatment:    Reason Eval/Treat Not Completed: (P) Patient not medically ready Pt remains on bedrest and per RN (Amy) the plan is for one way extubation at some point. PT will follow back to determine appropriateness for evaluation tomorrow.   Elynn Patteson B. Beverely Risen PT, DPT Acute Rehabilitation Services Pager [phone] Office [phone]    Elon Alas [place] 08/29/2018, 2:04 PM

## 2018-08-29 NOTE — Progress Notes (Signed)
PT Cancellation Note  Patient Details Name: Beth Pope MRN: [id number] DOB: [birth year]   Cancelled Treatment:    Reason Eval/Treat Not Completed: (P) Patient not medically ready Pt currently on bedrest. PT will check back this afternoon for treatment.  Sally-Ann Cutbirth B. Beverely Risen PT, DPT Acute Rehabilitation Services Pager [phone] Office [phone]    Elon Alas [facility] 08/29/2018, 8:47 AM

## 2018-08-29 NOTE — Progress Notes (Signed)
OT Cancellation Note  Patient Details Name: Beth Pope MRN: [id number] DOB: [birth year]   Cancelled Treatment:    Reason Eval/Treat Not Completed: Patient not medically ready;Active bedrest order. Pt remains on bedrest and per RN (Amy) the plan is for one way extubation at some point. Will continue follow on Monday for appropriateness for eval.  Ignacia Palma, OTR/L Acute Rehab Services Pager [phone] Office [phone]     [name] 08/29/2018, 11:40 AM

## 2018-08-30 ENCOUNTER — Inpatient Hospital Stay (HOSPITAL_COMMUNITY): Payer: Medicare Other

## 2018-08-30 DIAGNOSIS — Z515 Encounter for palliative care: Secondary | ICD-10-CM

## 2018-08-30 DIAGNOSIS — Z7189 Other specified counseling: Secondary | ICD-10-CM

## 2018-08-30 DIAGNOSIS — J9690 Respiratory failure, unspecified, unspecified whether with hypoxia or hypercapnia: Secondary | ICD-10-CM

## 2018-08-30 DIAGNOSIS — Z9911 Dependence on respirator [ventilator] status: Secondary | ICD-10-CM

## 2018-08-30 DIAGNOSIS — I63312 Cerebral infarction due to thrombosis of left middle cerebral artery: Secondary | ICD-10-CM

## 2018-08-30 DIAGNOSIS — J9600 Acute respiratory failure, unspecified whether with hypoxia or hypercapnia: Secondary | ICD-10-CM

## 2018-08-30 DIAGNOSIS — I611 Nontraumatic intracerebral hemorrhage in hemisphere, cortical: Secondary | ICD-10-CM

## 2018-08-30 LAB — GLUCOSE, CAPILLARY
GLUCOSE-CAPILLARY: 92 mg/dL (ref 70–99)
GLUCOSE-CAPILLARY: 155 mg/dL — AB (ref 70–99)
Glucose-Capillary: 113 mg/dL — ABNORMAL HIGH (ref 70–99)
Glucose-Capillary: 131 mg/dL — ABNORMAL HIGH (ref 70–99)
Glucose-Capillary: 136 mg/dL — ABNORMAL HIGH (ref 70–99)
Glucose-Capillary: 193 mg/dL — ABNORMAL HIGH (ref 70–99)

## 2018-08-30 LAB — BASIC METABOLIC PANEL
Anion gap: 10 (ref 5–15)
BUN: 21 mg/dL (ref 8–23)
CALCIUM: 9.1 mg/dL (ref 8.9–10.3)
CHLORIDE: 108 mmol/L (ref 98–111)
CO2: 24 mmol/L (ref 22–32)
CREATININE: 0.59 mg/dL (ref 0.44–1.00)
GFR calc Af Amer: >60 mL/min (ref >60)
GFR calc non Af Amer: >60 mL/min (ref >60)
Glucose, Bld: 97 mg/dL (ref 70–99)
Potassium: 3.5 mmol/L (ref 3.5–5.1)
Sodium: 142 mmol/L (ref 135–145)

## 2018-08-30 LAB — PHOSPHORUS: Phosphorus: 2.9 mg/dL (ref 2.5–4.6)

## 2018-08-30 LAB — MAGNESIUM: Magnesium: 1.9 mg/dL (ref 1.7–2.4)

## 2018-08-30 MED ORDER — CARVEDILOL 12.5 MG PO TABS
12.5000 mg | ORAL_TABLET | Freq: Two times a day (BID) | ORAL | Status: DC
  Administered 2018-08-30 – 2018-09-02 (×7): 12.5 mg
  Filled 2018-08-30 (×7): qty 1

## 2018-08-30 MED ORDER — DEXTROSE 50 % IV SOLN
25.0000 mL | Freq: Once | INTRAVENOUS | Status: AC
  Administered 2018-08-30: 25 mL via INTRAVENOUS

## 2018-08-30 MED ORDER — IPRATROPIUM-ALBUTEROL 0.5-2.5 (3) MG/3ML IN SOLN
3.0000 mL | Freq: Four times a day (QID) | RESPIRATORY_TRACT | Status: DC
  Administered 2018-08-30 – 2018-09-03 (×17): 3 mL via RESPIRATORY_TRACT
  Filled 2018-08-30 (×17): qty 3

## 2018-08-30 MED ORDER — DEXTROSE 50 % IV SOLN
INTRAVENOUS | Status: AC
  Filled 2018-08-30: qty 50

## 2018-08-30 NOTE — Evaluation (Signed)
Physical Therapy Evaluation Patient Details Name: Beth Pope MRN: [id number] DOB: 11-03-1940 Today's Date: 08/30/2018   History of Present Illness  Beth Pope is an 78 y.o. female past medical history of diabetes mellitus, COPD, hypertension presents to the emergency room as a code stroke for sudden onset right-sided weakness, facial droop and slurred speech.  TPA given, but later pt showed to be having neurological symptoms.  CT then showed multiple focal SAH without midline shift.  Pt was intubated.  She remains intubated via ETT.  Palliative consulted.  Clinical Impression  Pt admitted with/for s/s of stroke described above.  Pt also suffered multiple SAH's.  Presently pt unable to follow instruction, shows little purposeful movement and needs total assist of 2 for mobilizing safely.  Pt currently limited functionally due to the problems listed. ( See problems list.)   Pt will benefit from PT to maximize function and safety in order to get ready for next venue listed below.     Follow Up Recommendations SNF;Other (comment)(more likely than CIR)    Equipment Recommendations  Other (comment)(TBA)    Recommendations for Other Services       Precautions / Restrictions Precautions Precaution Comments: intubated by ETT      Mobility  Bed Mobility Overal bed mobility: Needs Assistance Bed Mobility: Supine to Sit;Sit to Supine     Supine to sit: Total assist Sit to supine: Total assist   General bed mobility comments: Needs +2 for safety.  Pt does not assist purposefully  Transfers Overall transfer level: Needs assistance               General transfer comment: not attempted due to only +1 available and pt more restless up.  Ambulation/Gait                Stairs            Wheelchair Mobility    Modified Rankin (Stroke Patients Only) Modified Rankin (Stroke Patients Only) Pre-Morbid Rankin Score: No symptoms Modified Rankin: Severe  disability     Balance Overall balance assessment: Needs assistance Sitting-balance support: Single extremity supported;Bilateral upper extremity supported Sitting balance-Leahy Scale: Poor Sitting balance - Comments: pt with little truncal control.  Pt able to sit with at least 1 UE assist for seconds without any other assist, but generally listing L/R or posteriorly                                     Pertinent Vitals/Pain Pain Assessment: Faces Faces Pain Scale: No hurt    Home Living Family/patient expects to be discharged to:: Unsure                 Additional Comments: No family present.  Palliative spoke to pt's dtr who states pt is outgoing and touch, "loves to cook"  No home environment or PLOF related.    Prior Function                 Hand Dominance        Extremity/Trunk Assessment   Upper Extremity Assessment Upper Extremity Assessment: Difficult to assess due to impaired cognition(moves bil UE's spontaneously, R UE reaches to mouth/ETT)    Lower Extremity Assessment Lower Extremity Assessment: RLE deficits/detail;LLE deficits/detail RLE Deficits / Details: Moves spontaneously/writhes, but not purposefully and not overtly against gravity sitting EOB RLE Coordination: decreased fine motor LLE Deficits / Details: minimal spontaneous movement, no  purposeful movement and none against gravity sitting EOB LLE Coordination: decreased fine motor;decreased gross motor    Cervical / Trunk Assessment Cervical / Trunk Assessment: Kyphotic  Communication   Communication: Other (comment)(Unable due to intubated.)  Cognition Arousal/Alertness: Awake/alert;Lethargic Behavior During Therapy: Restless Overall Cognitive Status: Impaired/Different from baseline                                 General Comments: Does not follow commands consistently.      General Comments General comments (skin integrity, edema, etc.): vss on  vent    Exercises Other Exercises Other Exercises: completed basic PROM/AAROM to all 4 extremities as warm up.   Assessment/Plan    PT Assessment Patient needs continued PT services  PT Problem List Decreased strength;Decreased activity tolerance;Decreased balance;Decreased mobility;Decreased coordination;Decreased safety awareness       PT Treatment Interventions Functional mobility training;Therapeutic activities;Therapeutic exercise;Balance training;Patient/family education;Neuromuscular re-education    PT Goals (Current goals can be found in the Care Plan section)  Acute Rehab PT Goals PT Goal Formulation: Patient unable to participate in goal setting Time For Goal Achievement: 09/13/18 Potential to Achieve Goals: Fair    Frequency Min 3X/week   Barriers to discharge        Co-evaluation               AM-PAC PT "6 Clicks" Mobility  Outcome Measure Help needed turning from your back to your side while in a flat bed without using bedrails?: Total Help needed moving from lying on your back to sitting on the side of a flat bed without using bedrails?: Total Help needed moving to and from a bed to a chair (including a wheelchair)?: Total Help needed standing up from a chair using your arms (e.g., wheelchair or bedside chair)?: Total Help needed to walk in hospital room?: Total Help needed climbing 3-5 steps with a railing? : Total 6 Click Score: 6    End of Session Equipment Utilized During Treatment: Oxygen Activity Tolerance: Patient tolerated treatment well Patient left: in bed;with call bell/phone within reach;with nursing/sitter in room;with bed alarm set Nurse Communication: Mobility status PT Visit Diagnosis: Hemiplegia and hemiparesis;Other symptoms and signs involving the nervous system (R29.898);Muscle weakness (generalized) (M62.81) Hemiplegia - Right/Left: (L>R) Hemiplegia - caused by: Cerebral infarction;Nontraumatic SAH    Time: 0938-1829 PT Time  Calculation (min) (ACUTE ONLY): 27 min   Charges:   PT Evaluation $PT Eval High Complexity: 1 High PT Treatments $Therapeutic Activity: 8-22 mins        08/30/2018  Benton Bing, PT Acute Rehabilitation Services [phone]  (pager) [phone]  (office)  Eliseo Gum Neda Willenbring 08/30/2018, 5:20 PM

## 2018-08-30 NOTE — Progress Notes (Signed)
Hypoglycemic Event  CBG: 57  Treatment: D50 25 mL (12.5 gm)  Symptoms: None  Follow-up CBG: Time:0350 CBG Result:97  Possible Reasons for Event: Medication Regimen    Beth Pope, Beth Pope

## 2018-08-30 NOTE — Progress Notes (Signed)
NAME:  Beth Pope, MRN:  [id number], DOB:  Jun 18, 1940, LOS: 5 ADMISSION DATE:  08/31/2018, CONSULTATION DATE:  08/26/18 REFERRING MD:  Aroor  CHIEF COMPLAINT:  AMS   Brief History   Beth Pope is a 78 y.o. female who was admitted 3/23 with concern for CVA (NIH 6).  She received tPA and later had ICH.  She required intubation and was then transferred to the neuro ICU. She received 1g of TXA and was then transferred to the neuro ICU and PCCM was asked to see in consultation.  Past Medical History  HTN, COPD, DM.  Significant Hospital Events   3/23 Admit. 3/24 Intubated after ICH following tPA. 3/26 Remains on vent, PSV wean 10/5 08/29/2018 not extubated at this time.  Consults:  PCCM.  Procedures:  ETT 3/24 >> L radial a line 3/24 >>   Significant Diagnostic Tests:  CT head 3/23 > negative. CT head 3/24 > multifocal IPH and SAH. CT head 3/24 > worsening ICH with new IPH.  Slightly increased size of multiple other small foci of hemorrhage. MRI/MRA brain 3/24 > increase in both size and number of multiple intracranial hemorrhages of an acute nature, MRA with no flow limiting stenosis or acute findings  Echo 3/24 > LVEF 65-70%, mild LVH, grade 1 DD  Micro Data:  Tracheal Aspirate 3/24 >>   Antimicrobials:     Interim history/subjective:  No issues overnight.  Remains off of IV drips.  Off sedation.  Not following any purposeful commands.  She does have spontaneous movements.  And grimace to pain.  Objective:  Blood pressure (!) 144/49, pulse 96, temperature 99.6 F (37.6 C), temperature source Axillary, resp. rate (!) 23, height  (1.6 m), weight 91.6 kg, SpO2 100 %.    Vent Mode: CPAP;PSV FiO2 (%):  [40 %] 40 % Set Rate:  [16 bmp] 16 bmp Vt Set:  [420 mL] 420 mL PEEP:  [5 cmH20] 5 cmH20 Pressure Support:  [10 cmH20] 10 cmH20 Plateau Pressure:  [15 cmH20-20 cmH20] 18 cmH20   Intake/Output Summary (Last 24 hours) at 08/30/2018 0932 Last data filed at  08/30/2018 0800 Gross per 24 hour  Intake 1842.15 ml  Output 700 ml  Net 1142.15 ml   Filed Weights   08/28/18 0500 08/29/18 0428 08/30/18 0336  Weight: 90.8 kg 90.3 kg 91.6 kg    Examination:  General: Elderly, obese female, unresponsive on mechanical ventilation HEENT: Endotracheal tube in place, pupils reactive, no evidence of nystagmus with lid lift, will not open eyes to voice Neuro: Moves all 4 extremities, all of this occurs spontaneously.  No purposeful movements CV: S1-S2, no MRG, regular rate and rhythm PULM: Lateral ventilated breath sounds, no crackles no wheeze GI:, Nontender, nondistended Extremities: No significant edema Skin: No obvious rash.   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  S/p 1g TXA P: Stroke management per neurology, continue Keppra  Acute Respiratory Insufficiency acute hypoxemic respiratory failure requiring intubation mechanical ventilation at this point mental status is precluding liberation from the ventilator as she is not able to protect her airway. -due to inability to protect the airway in the setting of CVA / ICH. P: At this point patient remains on pressure regulated volume control. Switching to pressure port she still has low tidal volumes. She also fails her SAT as she is not awake enough to be removed from mechanical ventilator. At this point would strongly consider consultation with palliative  care. I will discuss this with the patient's primary stroke team.  Hypertensive Emergency 08/29/2018 systolic blood pressure ranged from 128-164 P:  Blood pressure goals of 140 systolic Continue PRN hydralazine Continue Norvasc Coreg and lisinopril.  AKI Will continue to follow serum creatinine Monitor  Hypokalemia  P: Monitor and replete as needed   Anemia  Thrombocytopenia  P: Monitor, replete as needed  DM CBG (last 3)  Continue SSI per protocol  At Risk Malnutrition   P:  Tube feeding protocol  Best Practice:  Diet: NPO / TF    Pain/Anxiety/Delirium protocol (if indicated): Currently not on sedation VAP protocol (if indicated): In place. DVT prophylaxis: SCD's. GI prophylaxis: PPI. Glucose control: SSI. Mobility: Bedrest. Code Status: Made DNR by neurology 08/27/2018 Family Communication: 08/29/2018 no family at bedside Disposition: ICU.  Labs   CBC: Recent Labs  Lab 09/23/2018 2217 08/26/18 0156 08/27/18 0401 08/27/18 0432 08/28/18 0411 08/29/18 0336  WBC 5.9  --   --  11.7* 13.7* 16.3*  NEUTROABS 2.8  --   --   --   --   --   HGB 12.0 13.9 11.2* 10.0* 9.9* 9.5*  HCT 41.2 41.0 33.0* 32.7* 32.3* 30.7*  MCV 71.9*  --   --  71.2* 71.6* 70.7*  PLT 160  --   --  137* 136* 136*   Basic Metabolic Panel: Recent Labs  Lab 23-Sep-2018 2217 23-Sep-2018 2223  08/26/18 1710 08/27/18 0401 08/27/18 0432 08/27/18 1700 08/28/18 0411 08/29/18 0336 08/30/18 0447  NA 135  --    < >  --  139 138  --  137 139 142  K 5.0  --    < >  --  3.3* 3.2*  --  4.0 3.8 3.5  CL 99  --   --   --   --  106  --  107 110 108  CO2 28  --   --   --   --  24  --  22 23 24   GLUCOSE 331*  --   --   --   --  171*  --  221* 158* 97  BUN 8  --   --   --   --  13  --  21 23 21   CREATININE 1.24* 1.20*  --   --   --  0.75  --  0.70 0.70 0.59  CALCIUM 8.9  --   --   --   --  8.5*  --  8.9 8.9 9.1  MG  --   --   --  1.7  --  1.8 1.8 1.9  --  1.9  PHOS  --   --   --  3.2  --  3.5 3.0 3.4  --  2.9   < > = values in this interval not displayed.   GFR: Estimated Creatinine Clearance: 60.3 mL/min (by C-G formula based on SCr of 0.59 mg/dL). Recent Labs  Lab 09-23-18 2217 08/27/18 0432 08/28/18 0411 08/29/18 0336  WBC 5.9 11.7* 13.7* 16.3*   Liver Function Tests: Recent Labs  Lab September 23, 2018 2217 08/28/18 0411  AST 27 15  ALT 13 9  ALKPHOS 93 54  BILITOT 1.0 0.6  PROT 7.0 5.4*  ALBUMIN 3.8 2.6*   No results for input(s): LIPASE, AMYLASE in the last 168 hours. No  results for input(s): AMMONIA in the last 168 hours. ABG    Component Value Date/Time   PHART 7.384 08/27/2018 0401   PCO2ART 44.1 08/27/2018 0401  PO2ART 188.0 (H) 08/27/2018 0401   HCO3 26.3 08/27/2018 0401   TCO2 28 08/27/2018 0401   O2SAT 100.0 08/27/2018 0401    Coagulation Profile: Recent Labs  Lab 08/20/2018 2300  INR 1.0   Cardiac Enzymes: No results for input(s): CKTOTAL, CKMB, CKMBINDEX, TROPONINI in the last 168 hours. HbA1C: Hgb A1c MFr Bld  Date/Time Value Ref Range Status  08/26/2018 05:44 AM 10.4 (H) 4.8 - 5.6 % Final    Comment:    (NOTE) Pre diabetes:          5.7%-6.4% Diabetes:              >6.4% Glycemic control for   <7.0% adults with diabetes    CBG: Recent Labs  Lab 08/29/18 1127 08/29/18 1538 08/29/18 2339 08/30/18 0355 08/30/18 0831  GLUCAP 131* 160* 131* 92 155*    This patient is critically ill with multiple organ system failure; which, requires frequent high complexity decision making, assessment, support, evaluation, and titration of therapies. This was completed through the application of advanced monitoring technologies and extensive interpretation of multiple databases. During this encounter critical care time was devoted to patient care services described in this note for 32 minutes.   Josephine Igo, DO [facility] 08/30/2018 9:32 AM  Personal pager: [phone] If unanswered, please page CCM On-call: #[phone]

## 2018-08-30 NOTE — Progress Notes (Signed)
STROKE TEAM PROGRESS NOTE   INTERVAL HISTORY Patient still intubated, off sedation.  Eyes open on voice, not following commands.  Moving all extremities on pain stimulation, left more than right. Discussed with CCM and would like to have palliative care involved for GOC discussion.   Vitals:   08/30/18 0530 08/30/18 0600 08/30/18 0630 08/30/18 0700  BP: (!) 155/52 (!) 165/50 (!) 154/61 (!) 166/54  Pulse: 95 95 95 94  Resp: Temp:      TempSrc:      SpO2: 100% 100% 100% 100%  Weight:      Height:       CBG (last 3)  Recent Labs    08/29/18 1538 08/29/18 2339 08/30/18 0355  GLUCAP 160* 131* 92    CBC:  Recent Labs  Lab 08/08/2018 2217  08/28/18 0411 08/29/18 0336  WBC 5.9   < > 13.7* 16.3*  NEUTROABS 2.8  --   --   --   HGB 12.0   < > 9.9* 9.5*  HCT 41.2   < > 32.3* 30.7*  MCV 71.9*   < > 71.6* 70.7*  PLT 160   < > 136* 136*   < > = values in this interval not displayed.    Basic Metabolic Panel:  Recent Labs  Lab 08/28/18 0411 08/29/18 0336 08/30/18 0447  NA 137 139 142  K 4.0 3.8 3.5  CL 107 110 108  CO2 GLUCOSE 221* 158* 97  BUN CREATININE 0.70 0.70 0.59  CALCIUM 8.9 8.9 9.1  MG 1.9  --  1.9  PHOS 3.4  --  2.9   Lipid Panel:     Component Value Date/Time   CHOL 178 08/26/2018 0500   TRIG 191 (H) 08/26/2018 0500   HDL 38 (L) 08/26/2018 0500   CHOLHDL 4.7 08/26/2018 0500   VLDL 38 08/26/2018 0500   LDLCALC 102 (H) 08/26/2018 0500   HgbA1c:  Lab Results  Component Value Date   HGBA1C 10.4 (H) 08/26/2018   Urine Drug Screen: No results found for: LABOPIA, COCAINSCRNUR, LABBENZ, AMPHETMU, THCU, LABBARB  Alcohol Level     Component Value Date/Time   ETH <10 08/21/2018 2225    IMAGING Ct Head Wo Contrast  Result Date: 08/26/2018 CLINICAL DATA:  Status post tPA with intracranial hemorrhage. EXAM: CT HEAD WITHOUT CONTRAST TECHNIQUE: Contiguous axial images were obtained from the base of the skull through the vertex  without intravenous contrast. COMPARISON:  Head CT 08/26/2018 FINDINGS: Brain: Allowing for the a small amount of redistribution, the size and distribution of hemorrhagic sites within the brain are unchanged. There is no midline shift or other mass effect. Size and configuration of the ventricles are unchanged. Vascular: No abnormal hyperdensity of the major intracranial arteries or dural venous sinuses. No intracranial atherosclerosis. Skull: The visualized skull base, calvarium and extracranial soft tissues are normal. Sinuses/Orbits: No fluid levels or advanced mucosal thickening of the visualized paranasal sinuses. No mastoid or middle ear effusion. The orbits are normal. IMPRESSION: Unchanged size and distribution of hemorrhagic foci within the brain. Electronically Signed   By: Deatra Robinson M.D.   On: 08/26/2018 23:31   Ct Head Wo Contrast  Result Date: 08/26/2018 CLINICAL DATA:  Intracranial hemorrhage follow up, status post tPA EXAM: CT HEAD WITHOUT CONTRAST TECHNIQUE: Contiguous axial images were obtained from the base of the skull through the vertex without intravenous contrast. COMPARISON:  08/26/2018 FINDINGS: Brain: Intracranial hemorrhage  has worsened since the prior scan. There is a new focus of intraparenchymal hemorrhage in the right temporal lobe that measures 3.3 x 1.5 x 1.6 cm (volume = 4.1 cm^3). Bilateral occipital lobe hemorrhages are slightly larger. Distribution of subarachnoid blood is unchanged. No midline shift or other mass effect. Vascular: No abnormal hyperdensity of the major intracranial arteries or dural venous sinuses. No intracranial atherosclerosis. Skull: The visualized skull base, calvarium and extracranial soft tissues are normal. Sinuses/Orbits: No fluid levels or advanced mucosal thickening of the visualized paranasal sinuses. No mastoid or middle ear effusion. The orbits are normal. IMPRESSION: 1. Worsening intracranial hemorrhage, most notably within the right  temporal lobe, where there is a new intraparenchymal hematoma that measures 4.1 mL. 2. Slightly increased size of multiple other small foci of hemorrhage with unchanged distribution of subarachnoid blood. 3. No midline shift or other mass effect. 4. Critical Value/emergent results were called by telephone at the time of interpretation on 08/26/2018 at 1:54 am to Dr. Georgiana Spinner Aroor, who verbally acknowledged these results. Electronically Signed   By: Deatra Robinson M.D.   On: 08/26/2018 01:55   Ct Head Wo Contrast  Addendum Date: 08/26/2018   ADDENDUM REPORT: 08/26/2018 00:37 ADDENDUM: Critical Value/emergent results were called by telephone at the time of interpretation on 08/26/2018 at 12:36 am to Dr. Georgiana Spinner Aroor , who verbally acknowledged these results. Electronically Signed   By: Deatra Robinson M.D.   On: 08/26/2018 00:37   Result Date: 08/26/2018 CLINICAL DATA:  Status post tPA EXAM: CT HEAD WITHOUT CONTRAST TECHNIQUE: Contiguous axial images were obtained from the base of the skull through the vertex without intravenous contrast. COMPARISON:  None. FINDINGS: Brain: There is multifocal subarachnoid and parenchymal hemorrhage within both hemispheres. Largest area hemorrhages in the left frontal lobe, measuring approximately 1.6 x 1.0 x 1.6 cm (volume = 1.3 cm^3). Smaller foci of hemorrhage are located within both occipital lobes. The subarachnoid blood is greatest over the right parietal convexity. No midline shift or other mass effect. There is periventricular hypoattenuation compatible with chronic microvascular disease. Vascular: No abnormal hyperdensity of the major intracranial arteries or dural venous sinuses. No intracranial atherosclerosis. Skull: The visualized skull base, calvarium and extracranial soft tissues are normal. Sinuses/Orbits: No fluid levels or advanced mucosal thickening of the visualized paranasal sinuses. No mastoid or middle ear effusion. The orbits are normal. IMPRESSION:  Multifocal acute intraparenchymal and subarachnoid hemorrhage involving both hemispheres, greatest in the left frontal lobe. No associated mass effect. Electronically Signed: By: Deatra Robinson M.D. On: 08/26/2018 00:28   Mr Brain Wo Contrast  Result Date: 08/26/2018 CLINICAL DATA:  LEFT brain infarct status post tPA with resultant intracranial hemorrhage. EXAM: MRI HEAD WITHOUT CONTRAST MRA HEAD WITHOUT CONTRAST TECHNIQUE: Multiplanar, multiecho pulse sequences of the brain and surrounding structures were obtained without intravenous contrast. Angiographic images of the head were obtained using MRA technique without contrast. COMPARISON:  Prior CT exams, most recent earlier today at 1:33 a.m. Most recent prior MR 02/10/2018. FINDINGS: MRI HEAD FINDINGS Brain: Apparent increase in size and number of multiple intracranial hemorrhages of an acute nature. Largest index lesion, measured on susceptibility weighting series 10, is 2 x 4 cm, increased from 1.5 x 3 cm earlier. Similar increase in medial RIGHT occipital lesion now 2 cm diameter, previous less than 1.5 cm. There is a posterior predominance of lesions, in the RIGHT greater than LEFT occipital and posterior parietal lobe, RIGHT greater than LEFT temporal lobe, raising the question of PRES. Numerous  punctate foci of hemosiderin deposition were present on the previous MR from September 2019, and the imaging findings could represent manifestation of underlying hypertensive cerebrovascular disease versus cerebral amyloid angiopathy, complicated by post tPA hemorrhage. Baseline atrophy and small vessel disease. No extra-axial collections. Vascular: Reported separately. Skull and upper cervical spine: Normal marrow signal. Sinuses/Orbits: Noncontributory Other: None MRA HEAD FINDINGS The internal carotid arteries are widely patent. The basilar artery is widely patent with vertebrals codominant. Fetal RIGHT PCA. No proximal intracranial stenosis or saccular  aneurysm. No visible changes of vasculitis or vasospasm. IMPRESSION: Increase in both size and number of multiple intracranial hemorrhages of an acute nature. Considerations include hypertensive cerebrovascular disease, cerebral amyloid angiopathy, complication of tPA, or hemorrhagic PRES. MRA of the intracranial circulation demonstrates no flow-limiting stenosis, large vessel vasculitis, vasospasm, or other significant finding. These results were called by telephone at the time of interpretation on 08/26/2018 at 3:25 pm to Dr. Roda Shutters , who verbally acknowledged these results. Electronically Signed   By: Elsie Stain M.D.   On: 08/26/2018 15:32   Dg Chest Port 1 View  Result Date: 08/27/2018 CLINICAL DATA:  Respiratory failure.  ET tube placement. EXAM: PORTABLE CHEST 1 VIEW COMPARISON:  08/26/2018 FINDINGS: Normal heart size. Aortic atherosclerosis. The endotracheal tube tip is 1.5 cm above the carina. There is an enteric 2 with tip projecting over the expected location of the body of stomach. Side port is below the level of the GE junction. Small bilateral pleural effusions and mild pulmonary vascular congestion. Atelectasis is noted in both lung bases. IMPRESSION: Small bilateral pleural effusions and bibasilar atelectasis. Electronically Signed   By: Signa Kell M.D.   On: 08/27/2018 08:02   Dg Chest Port 1 View  Result Date: 08/26/2018 CLINICAL DATA:  Intubation. EXAM: PORTABLE CHEST 1 VIEW COMPARISON:  02/14/2018 FINDINGS: Endotracheal tube tip slightly low in positioning 16 mm from the carina. Tip and side port of the enteric tube below the diaphragm in the stomach. Low lung volumes. Unchanged heart size and mediastinal contours with aortic tortuosity. Bibasilar atelectasis. No evidence of pulmonary edema, large pleural effusion or pneumothorax. Scattered peripheral fibrosis. IMPRESSION: 1. Endotracheal tube tip slightly low in positioning 16 mm from the carina, consider retraction of 1-2 cm. Enteric  tube in place. 2. Low lung volumes with bibasilar atelectasis. Electronically Signed   By: Narda Rutherford M.D.   On: 08/26/2018 00:50   Mr Shirlee Latch DV Contrast  Result Date: 08/26/2018 CLINICAL DATA:  LEFT brain infarct status post tPA with resultant intracranial hemorrhage. EXAM: MRI HEAD WITHOUT CONTRAST MRA HEAD WITHOUT CONTRAST TECHNIQUE: Multiplanar, multiecho pulse sequences of the brain and surrounding structures were obtained without intravenous contrast. Angiographic images of the head were obtained using MRA technique without contrast. COMPARISON:  Prior CT exams, most recent earlier today at 1:33 a.m. Most recent prior MR 02/10/2018. FINDINGS: MRI HEAD FINDINGS Brain: Apparent increase in size and number of multiple intracranial hemorrhages of an acute nature. Largest index lesion, measured on susceptibility weighting series 10, is 2 x 4 cm, increased from 1.5 x 3 cm earlier. Similar increase in medial RIGHT occipital lesion now 2 cm diameter, previous less than 1.5 cm. There is a posterior predominance of lesions, in the RIGHT greater than LEFT occipital and posterior parietal lobe, RIGHT greater than LEFT temporal lobe, raising the question of PRES. Numerous punctate foci of hemosiderin deposition were present on the previous MR from September 2019, and the imaging findings could represent manifestation of underlying hypertensive  cerebrovascular disease versus cerebral amyloid angiopathy, complicated by post tPA hemorrhage. Baseline atrophy and small vessel disease. No extra-axial collections. Vascular: Reported separately. Skull and upper cervical spine: Normal marrow signal. Sinuses/Orbits: Noncontributory Other: None MRA HEAD FINDINGS The internal carotid arteries are widely patent. The basilar artery is widely patent with vertebrals codominant. Fetal RIGHT PCA. No proximal intracranial stenosis or saccular aneurysm. No visible changes of vasculitis or vasospasm. IMPRESSION: Increase in both  size and number of multiple intracranial hemorrhages of an acute nature. Considerations include hypertensive cerebrovascular disease, cerebral amyloid angiopathy, complication of tPA, or hemorrhagic PRES. MRA of the intracranial circulation demonstrates no flow-limiting stenosis, large vessel vasculitis, vasospasm, or other significant finding. These results were called by telephone at the time of interpretation on 08/26/2018 at 3:25 pm to Dr. Roda Shutters , who verbally acknowledged these results. Electronically Signed   By: Elsie Stain M.D.   On: 08/26/2018 15:32   Ct Head Code Stroke Wo Contrast  Result Date: 08/26/18 CLINICAL DATA:  Code stroke. Right-sided weakness. Last seen normal 21:30. EXAM: CT HEAD WITHOUT CONTRAST TECHNIQUE: Contiguous axial images were obtained from the base of the skull through the vertex without intravenous contrast. COMPARISON:  02/10/2018 FINDINGS: Brain: There is no mass, hemorrhage or extra-axial collection. The size and configuration of the ventricles and extra-axial CSF spaces are normal. There is hypoattenuation of the periventricular white matter, most commonly indicating chronic ischemic microangiopathy. Vascular: No abnormal hyperdensity of the major intracranial arteries or dural venous sinuses. No intracranial atherosclerosis. Skull: The visualized skull base, calvarium and extracranial soft tissues are normal. Sinuses/Orbits: No fluid levels or advanced mucosal thickening of the visualized paranasal sinuses. No mastoid or middle ear effusion. The orbits are normal. ASPECTS [facility] Stroke Program Early CT Score) - Ganglionic level infarction (caudate, lentiform nuclei, internal capsule, insula, M1-M3 cortex): 7 - Supraganglionic infarction (M4-M6 cortex): 3 Total score (0-10 with 10 being normal): 10 IMPRESSION: 1. No acute intracranial abnormality. 2. ASPECTS is 10. * These results were communicated to Dr. Georgiana Spinner Aroor at 10:31 pm on 2018-08-26 by text page via the [facility]  messaging system. Electronically Signed   By: Deatra Robinson M.D.   On: August 26, 2018 22:32   Carotid (at [facility])  Result Date: 08/26/2018 Carotid Arterial Duplex Study Indications: CVA. Limitations: Ventilator, Patient immobility, patient positioning Performing Technologist: Chanda Busing RVT  Examination Guidelines: A complete evaluation includes B-mode imaging, spectral Doppler, color Doppler, and power Doppler as needed of all accessible portions of each vessel. Bilateral testing is considered an integral part of a complete examination. Limited examinations for reoccurring indications may be performed as noted.  Right Carotid Findings: +----------+--------+--------+--------+---------------------+------------------+           PSV cm/sEDV cm/sStenosisDescribe             Comments           +----------+--------+--------+--------+---------------------+------------------+ CCA Prox  141     0               smooth and                                                                heterogenous                            +----------+--------+--------+--------+---------------------+------------------+  CCA [id number]     8               smooth and                                                                heterogenous                            +----------+--------+--------+--------+---------------------+------------------+ ICA Prox  82      7                                    High resistant                                                            flow               +----------+--------+--------+--------+---------------------+------------------+ ICA Distal75      9                                    High resistant                                                            flow               +----------+--------+--------+--------+---------------------+------------------+ ECA       71      0                                                        +----------+--------+--------+--------+---------------------+------------------+ +----------+--------+-------+--------+-------------------+           PSV cm/sEDV cmsDescribeArm Pressure (mmHG) +----------+--------+-------+--------+-------------------+ [id number]                                        +----------+--------+-------+--------+-------------------+ +---------+--------+--+--------+-+--------------+ VertebralPSV cm/s65EDV cm/s6High resistant +---------+--------+--+--------+-+--------------+  Left Carotid Findings: +----------+--------+--------+--------+---------------------+------------------+           PSV cm/sEDV cm/sStenosisDescribe             Comments           +----------+--------+--------+--------+---------------------+------------------+ CCA Prox  139     0                                    tortuous           +----------+--------+--------+--------+---------------------+------------------+ CCA Distal144     11  smooth and                                                                heterogenous                            +----------+--------+--------+--------+---------------------+------------------+ ICA Prox  93      7               smooth and           High resistant                                       heterogenous         flow               +----------+--------+--------+--------+---------------------+------------------+ ICA Distal124     8                                    High resistant                                                            flow               +----------+--------+--------+--------+---------------------+------------------+ ECA       91      0                                                       +----------+--------+--------+--------+---------------------+------------------+ +----------+--------+--------+--------+-------------------+ SubclavianPSV cm/sEDV  cm/sDescribeArm Pressure (mmHG) +----------+--------+--------+--------+-------------------+           278                                         +----------+--------+--------+--------+-------------------+ +---------+--------+--+--------+-+--------------+ VertebralPSV cm/s96EDV cm/s7High resistant +---------+--------+--+--------+-+--------------+  Summary: Right Carotid: Velocities in the right ICA are consistent with a 1-39% stenosis.                High resistant waveforms are noted in the proximal and distal ICA                and the vertebral artery. Left Carotid: Velocities in the left ICA are consistent with a 1-39% stenosis.               High resistant waveforms are noted in the proximal and distal ICA               and the vertebral artery.  *See table(s) above for measurements and observations.     Preliminary    Dg Chest Port 1 View 08/28/2018 Slightly improved lung volume. No change in bibasilar atelectasis/pneumonia.    2D  Echocardiogram  1. The left ventricle has hyperdynamic systolic function, with an ejection fraction of >65%. The cavity size was normal. There is mildly increased left ventricular wall thickness. Left ventricular diastolic Doppler parameters are consistent with  impaired relaxation. Indeterminate filling pressures The E/e' is 8-15. No evidence of left ventricular regional wall motion abnormalities.  2. The right ventricle has normal systolic function. The cavity was normal. There is no increase in right ventricular wall thickness.  3. The mitral valve is grossly normal.  4. The tricuspid valve is grossly normal.  5. The aortic valve is tricuspid Mild sclerosis of the aortic valve.  6. The inferior vena cava was normal in size with <50% respiratory variability.   PHYSICAL EXAM Temp:  [98.3 F (36.8 C)-99.4 F (37.4 C)] 98.3 F (36.8 C) (03/28 0400) Pulse Rate:  [72-103] 94 (03/28 0700) Resp:  [15-26] 15 (03/28 0700) BP: (91-179)/(25-122) 166/54  (03/28 0700) SpO2:  [99 %-100 %] 100 % (03/28 0700) FiO2 (%):  [40 %] 40 % (03/28 0344) Weight:  [91.6 kg] 91.6 kg (03/28 0336)  General - Well nourished, well developed, intubated off sedation.  Ophthalmologic - fundi not visualized due to noncooperation.  Cardiovascular - Regular rate and rhythm.  Neuro - intubated off sedation, eyes open on voice, not following commands.. With eye opening, eyes in left gaze position then moved to mid position, not blinking to visual threat, doll's eyes present, not tracking, PERRL. Corneal reflex present bilaterally, gag and cough present. Breathing over the vent.  Facial symmetry not able to test due to ET tube.  Tongue midline in mouth. On pain stimulation, brisk movement on the left side, 3+/5 LUE and 2/5 RUE, as well as 3/5 LLE and and 2/5 RLE. DTR diminished and no babinski. Sensation, coordination and gait not tested.   ASSESSMENT/PLAN Ms. Mc Hollen is a 78 y.o. female with history of DB, HTN, COPD presenting with R sided weakness, facial droop and slurred speech. BP 230 en route. CT neg. Received tPA 2018/09/16 at 2238. HA, mental status change and fluctuating BP post tPA. CT showed SAH and ICH. Reversed w/ TXA.   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 ASPECTS 10.   CT head 3/24 0006 multifocal B IPH and SAH. No mass effect  CT head  3/24 0144 worsening ICH w/ new R temporal hmg 4mL. slightly increased size of other small ICH with unchanged SAH. No shift.  MRI 3/24 1458 stable in size and number of mult IC hmgs.  MRA  Unremarkable   CT head 3/24 2113 unchanged size, distribution hmgs  Carotid Doppler B ICA 1-39% stenosis, VAs antegrade   2D Echo  EF > 65%. No source of embolus    LDL 102  HgbA1c 10.4  Fibrinogen 336 s/p TXA  SCDs for VTE prophylaxis  No antithrombotic prior to admission, now on No antithrombotic given post tPA hemorrhage   Started on Keppra 500 q 12h for seizure prophylaxis - will continue for total 7 days - end date adjusted under orders  Therapy recommendations:  pending   Disposition:  pending   Code status - DNR - daughter previously stated that pt would not want PEG or Trach.  Will have palliative care involved for goals of care discussion  Hypertensive Emergency  BP 230 per EMS  Lowered w. labetalol for tPA administration   Required cleviprex post tPA for fluctuating BP control  Off Cleviprex now  SBP goal < 160 (adjusted all med orders)  On po BP meds with amlodipine 10, lisinopril 20mg  bid  Increase Coreg to 12.5 twice daily  Close monitoring  Acute Reparatory Failure  Unable to protect airway following ICH after tPA  Intubated in ED  On low dose propofol   CCM on board  Mental status precludes extubation   Family stated that pt would not want PEG or Trach  Fever Leukocytosis  Tmax 100.8-> afebrile  CXR small b/l pleural effusion and atelectasis b/l basilar   Repeat CXR 3/26 slightly improved lung volume. No change in bibasilar atelectasis/pneumonia.   WBC 11.7>13.7>16.3  CCM on board  Ice pack and tylenol PRN  Close monitoring  Dysaphgia  Secondary to stroke  NPO  On tube feeding @ 20  Resume some home meds via OG  SLP signed off d/t continued intubation, reorder when needed  Hyperlipidemia  Home meds:  No statin  LDL 102, goal < 70  Hold statin given hemorrhage  Consider statin at discharge based on plan of care  Diabetes type II  HgbA1c 10.4, goal < 7.0  Uncontrolled  Home DM Meds: Humalog 75/25 Insulin- 28 units AM/ 22 units PM  On lantus 12 bid  novolog 3u q 4h as per DB RN recommends for TF coverage  CBG monitoring   Hyperglycemia improved  Mod  SSI  CCM on board  Other Stroke Risk Factors  Advanced age  Obesity, Body mass index is 35.77 kg/m., recommend weight loss, diet and exercise as appropriate   Other Active Problems  AKI, resolved Cr 1.2>0.70-> 0.59  COPD  Anemia, Hgb 13.9>11.2>10>9.9>9.5  Hypokalemia, resolved 3.8  Thrombocytopenia, stable PLT 136   Hospital day # 5  This patient is critically ill due to stroke, hemorrhage s/p tPA, hyperglycemia, hypertension, respiratory failure and at significant risk of neurological worsening, death form hematoma expansion, recurrent stroke, cerebral edema, seizure, brain herniation. This patient's care requires constant monitoring of vital signs, hemodynamics, respiratory and cardiac monitoring, review of multiple databases, neurological assessment, discussion with family, other specialists and medical decision making of high complexity. I spent 35 minutes of neurocritical care time in the care of this patient.   Marvel PlanJindong Daylan Juhnke, MD PhD Stroke Neurology 08/30/2018 10:52 AM   To contact Stroke Continuity provider, please refer to [email or web address]. After hours, contact General Neurology

## 2018-08-30 NOTE — Consult Note (Signed)
Consultation Note Date: 08/30/2018   Patient Name: Beth Pope  DOB: 25-Jan-1941  MRN: [id number]  Age / Sex: 78 y.o., female  PCP: Endocrinology, [facility] Referring Physician: Rosalin Hawking, MD  Reason for Consultation: Establishing goals of care  HPI/Patient Profile: 78 y.o. female  with past medical history of HTN, COPD, and DM admitted on 08/23/2018 with R sided weakness, facial droop, and slurred speech. CT initially negative. Received tPA in ED. Had mental status change post TPA - CT then revealed SAH and ICH. Received TXA.  She required intubation on 3/24.3/24 MRI revealed increase in both size and number of multiple ICH. Patient has been off of sedation - she is not following any commands - she will open eyes and grimace to pain. Unable to extubate patient d/t mental status. Per previous discussions with neurology, patient was made DNR and family stated she would never want trach/peg. PMT consulted for [place].  Clinical Assessment and Goals of Care: I have reviewed medical records including EPIC notes, labs and imaging, received report from Dr. Valeta Harms and RN, assessed the patient and then spoke with patient's daughter Ms. Chambers to discuss diagnosis prognosis, GOC, EOL wishes, disposition and options.  I introduced Palliative Medicine as specialized medical care for people living with serious illness. It focuses on providing relief from the symptoms and stress of a serious illness. The goal is to improve quality of life for both the patient and the family.  We discussed a brief life review of the patient. Ms Josiah Lobo describes her as outgoing and tough. Tells me how much she loves to cook and be around people.   When I initially asked Ms. Chambers her understanding of patient's illness, she tells me "she is getting better".   We discussed her current illness and what it means in the larger context of her on-going co-morbidities.  I shared  with Ms. 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 We discussed inability to wean Ms. Chambers from mechanical ventilation due to her mental status/inability to protect airway.  Ms. Josiah Lobo expresses understanding of this; however, telephone conversation was complicated by female voice in background yelling "do not listen to her! Her brain is fine!". Ms. Josiah Lobo asked this person to leave the room so that we could continue our conversation.   She tells me multiple times "I need a couple of days to process this". We discussed difficulties of this situation, emotional support provided to Ms. Chambers.   Ms. Josiah Lobo shares that there are other family members she needs to speak to about this situation and she needs a couple of days to do this as these conversations will be very emotional for her.   Questions and concerns were addressed. The family was encouraged to call with questions or concerns.   Primary Decision Maker NEXT OF KIN - daughter  SUMMARY OF RECOMMENDATIONS   - Initial palliative conversation - education completed about poor prognosis - patient's daughter/decision maker expresses understanding of situation; however, seems other family members complicating situation - emotional support provided to daughter - Daughter requests "a couple of days" to discuss situation with other family members and process information - PMT will follow closely and continue to support family/discuss goals of care  Code Status/Advance Care Planning:  DNR - determined by neurology 3/25  Symptom Management:   Per primary - on my exam patient shows no signs of distress  Palliative Prophylaxis:   Aspiration, Delirium Protocol, Frequent Pain Assessment and Turn  Reposition  Additional Recommendations (Limitations, Scope, Preferences):  No Tracheostomy  Prognosis:   Unable to determine  Discharge Planning: To Be Determined      Primary Diagnoses:  Present on Admission: . Stroke (cerebrum) ([place]) . Endotracheally intubated . Hypertension, essential . COPD without exacerbation ([place]) . Intracerebral hemorrhage ([place])   I have reviewed the medical record, interviewed the patient and family, and examined the patient. The following aspects are pertinent.  Past Medical History:  Diagnosis Date  . Arthritis   . COPD (chronic obstructive pulmonary disease) ([place])   . Diabetes mellitus without complication ([place])   . Hypertension    Social History   Socioeconomic History  . Marital status: Widowed    Spouse name: Not on file  . Number of children: Not on file  . Years of education: Not on file  . Highest education level: Not on file  Occupational History  . Not on file  Social Needs  . Financial resource strain: Not on file  . Food insecurity:    Worry: Not on file    Inability: Not on file  . Transportation needs:    Medical: Not on file    Non-medical: Not on file  Tobacco Use  . Smoking status: Never Smoker  . Smokeless tobacco: Never Used  Substance and Sexual Activity  . Alcohol use: No    Frequency: Never  . Drug use: No  . Sexual activity: Not on file  Lifestyle  . Physical activity:    Days per week: Not on file    Minutes per session: Not on file  . Stress: Not on file  Relationships  . Social connections:    Talks on phone: Not on file    Gets together: Not on file    Attends religious service: Not on file    Active member of club or organization: Not on file    Attends meetings of clubs or organizations: Not on file    Relationship status: Not on file  Other Topics Concern  . Not on file  Social History Narrative  . Not on file   No family history on file. Scheduled Meds: .  stroke: mapping our early stages of recovery book   Does not apply Once  . amLODipine  10 mg Per Tube Daily  . carvedilol  12.5 mg Per Tube BID  . chlorhexidine gluconate (MEDLINE KIT)  15 mL Mouth Rinse BID  . feeding  supplement (PRO-STAT SUGAR FREE 64)  30 mL Per Tube 5 X Daily  . feeding supplement (VITAL HIGH PROTEIN)  1,000 mL Per Tube Q24H  . insulin aspart  0-15 Units Subcutaneous Q4H  . insulin aspart  3 Units Subcutaneous Q4H  . insulin glargine  12 Units Subcutaneous BID  . ipratropium-albuterol  3 mL Nebulization QID  . lisinopril  20 mg Per Tube BID  . mouth rinse  15 mL Mouth Rinse 10 times per day  . multivitamin with minerals  1 tablet Per Tube Daily  . pantoprazole sodium  40 mg Per Tube Daily   Continuous Infusions: . sodium chloride 50 mL/hr at 08/30/18 0600  . levETIRAcetam 500 mg (08/30/18 0910)  . propofol (DIPRIVAN) infusion Stopped (08/28/18 0921)   PRN Meds:.acetaminophen **OR** acetaminophen (TYLENOL) oral liquid 160 mg/5 mL **OR** acetaminophen, fentaNYL (SUBLIMAZE) injection, hydrALAZINE, senna-docusate Allergies  Allergen Reactions  . Keflex [Cephalexin] Other (See Comments)    Not sure  . Penicillins Hives  . Sulfa Antibiotics Swelling  . Trulicity [Dulaglutide] Hives  Review of Systems  Unable to perform ROS: Intubated    Physical Exam Constitutional:      General: She is not in acute distress.    Interventions: She is intubated.     Comments: Flutters eyes to voice, does not follow commands  Cardiovascular:     Rate and Rhythm: Normal rate and regular rhythm.  Pulmonary:     Effort: Pulmonary effort is normal. She is intubated.  Musculoskeletal:     Right lower leg: No edema.     Left lower leg: No edema.  Skin:    General: Skin is warm and dry.     Vital Signs: BP (!) 131/55   Pulse 96   Temp 99.6 F (37.6 C) (Axillary)   Resp (!) 23   Ht '5\' 3"'  (1.6 m)   Wt 91.6 kg   SpO2 100%   BMI 35.77 kg/m  Pain Scale: CPOT   Pain Score: 0-No pain   SpO2: SpO2: 100 % O2 Device:SpO2: 100 % O2 Flow Rate: .   IO: Intake/output summary:   Intake/Output Summary (Last 24 hours) at 08/30/2018 1329 Last data filed at 08/30/2018 0800 Gross per 24 hour   Intake 1554.8 ml  Output 700 ml  Net 854.8 ml    LBM: Last BM Date: (PTA) Baseline Weight: Weight: 88.6 kg Most recent weight: Weight: 91.6 kg     Palliative Assessment/Data: PPS 10%    Time Total: 70 minutes Greater than 50%  of this time was spent counseling and coordinating care related to the above assessment and plan.  Juel Burrow, DNP, AGNP-C Palliative Medicine Team [phone] Pager: [phone]

## 2018-08-30 NOTE — Plan of Care (Signed)
  Problem: Nutrition: Goal: Adequate nutrition will be maintained Outcome: Progressing   

## 2018-08-31 ENCOUNTER — Inpatient Hospital Stay (HOSPITAL_COMMUNITY): Payer: Medicare Other

## 2018-08-31 DIAGNOSIS — Z7189 Other specified counseling: Secondary | ICD-10-CM

## 2018-08-31 LAB — GLUCOSE, CAPILLARY
Glucose-Capillary: 95 mg/dL (ref 70–99)
Glucose-Capillary: 91 mg/dL (ref 70–99)
Glucose-Capillary: 134 mg/dL — ABNORMAL HIGH (ref 70–99)
Glucose-Capillary: 167 mg/dL — ABNORMAL HIGH (ref 70–99)
Glucose-Capillary: 95 mg/dL (ref 70–99)
Glucose-Capillary: 122 mg/dL — ABNORMAL HIGH (ref 70–99)

## 2018-08-31 MED ORDER — MIDAZOLAM HCL 2 MG/2ML IJ SOLN
1.0000 mg | Freq: Once | INTRAMUSCULAR | Status: AC
  Administered 2018-08-31: 1 mg via INTRAVENOUS
  Filled 2018-08-31: qty 2

## 2018-08-31 MED ORDER — FENTANYL CITRATE (PF) 100 MCG/2ML IJ SOLN
50.0000 ug | INTRAMUSCULAR | Status: DC | PRN
  Administered 2018-08-31 – 2018-09-01 (×4): 75 ug via INTRAVENOUS
  Administered 2018-09-01: 50 ug via INTRAVENOUS
  Filled 2018-08-31 (×5): qty 2

## 2018-08-31 NOTE — Progress Notes (Signed)
NAME:  Beth Pope, MRN:  [id number], DOB:  1940/12/18, LOS: 6 ADMISSION DATE:  09-13-18, CONSULTATION DATE:  08/26/18 REFERRING MD:  [place]  CHIEF COMPLAINT:  AMS   Brief History   Beth Pope is a 78 y.o. female who was admitted 3/23 with concern for CVA (NIH 6).  She received tPA and later had ICH.  She required intubation and was then transferred to the neuro ICU. She received 1g of TXA and was then transferred to the neuro ICU and PCCM was asked to see in consultation.  Past Medical History  HTN, COPD, DM.  Significant Hospital Events   3/23 Admit. 3/24 Intubated after ICH following tPA. 3/26 Remains on vent, PSV wean 10/5 08/29/2018 not extubated at this time.  Consults:  PCCM.  Procedures:  ETT 3/24 >> L radial a line 3/24 >>   Significant Diagnostic Tests:  CT head 3/23 > negative. CT head 3/24 > multifocal IPH and SAH. CT head 3/24 > worsening ICH with new IPH.  Slightly increased size of multiple other small foci of hemorrhage. MRI/MRA brain 3/24 > increase in both size and number of multiple intracranial hemorrhages of an acute nature, MRA with no flow limiting stenosis or acute findings  Echo 3/24 > LVEF 65-70%, mild LVH, grade 1 DD  Micro Data:  Tracheal Aspirate 3/24 >>  MRSA PCR neg 3/24  Antimicrobials:     Interim history/subjective:  No issues overnight.  We appreciate palliative care's input and discussion with family yesterday via phone.  Patient is very comfortable appearing on the ventilator this morning.  Nonresponsive but moving around in the bed spontaneously.  Sedation was held to attempt improvement in any neurologic exam.  Objective:  Blood pressure (!) 162/61, pulse 85, temperature 98.3 F (36.8 C), temperature source Axillary, resp. rate (!) 21, height  (1.6 m), weight 92.7 kg, SpO2 100 %.    Vent Mode: CPAP;PSV FiO2 (%):  [40 %] 40 % Set Rate:  [16 bmp] 16 bmp Vt Set:  [420 mL] 420 mL PEEP:  [5 cmH20] 5 cmH20 Pressure  Support:  [10 cmH20] 10 cmH20 Plateau Pressure:  [19 cmH20-20 cmH20] 20 cmH20   Intake/Output Summary (Last 24 hours) at 08/31/2018 0858 Last data filed at 08/31/2018 0200 Gross per 24 hour  Intake 1273.33 ml  Output 800 ml  Net 473.33 ml   Filed Weights   08/29/18 0428 08/30/18 0336 08/31/18 0500  Weight: 90.3 kg 91.6 kg 92.7 kg    Examination:  General: Elderly female, obese, unresponsive on mechanical ventilation moving spontaneously HEENT: Tracheal tube in place no nystagmus with lid lift will not open eyes on response to pain or to voice Neuro: Moving all 4 extremities spontaneously no purposeful movements. CV: Regular rate and rhythm, S1-S2, no MRG PULM: Bilateral ventilated breath sounds no crackles no wheeze GI:, Nontender nondistended bowel sounds present Extremities: No significant edema Skin: No obvious rash  Assessment & Plan:   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  S/p 1g TXA P: Stroke management per neurology service, continue on Keppra  Acute Respiratory Insufficiency acute hypoxemic respiratory failure requiring intubation mechanical ventilation at this point mental status is precluding liberation from the ventilator as she is not able to protect her airway. -due to inability to protect the airway in the setting of CVA / ICH. P: At this point she remains on the mechanical ventilator.  I do not believe that she will be easily weaned from this  due to her mental status and stroke with hemorrhagic conversion. I have recommended that palliative care become involved. I called and spoke with him yesterday.  I appreciate their input.  At this point the patient does remain a durable DNR with no plans for tracheostomy tube.  The family does need some time to help get this together and to collect their thoughts.  Per the discussion with palliative. At this point she will remain on the ventilator until they are ready for a compassionate  withdrawal of care.  Hypertensive Emergency, resolved P:  Blood pressure goals remain at 140 Continue current antihypertensive regimen  AKI Follow serum creatinine  Hypokalemia  P: Monitor replete as needed  Anemia  Thrombocytopenia  P: Monitor replete as needed  DM CBG (last 3)  SSI per protocol  At Risk Malnutrition  P:  Tube feeding per protocol  Best Practice:  Diet: NPO / TF    Pain/Anxiety/Delirium protocol (if indicated): Currently not on sedation VAP protocol (if indicated): In place. DVT prophylaxis: SCD's. GI prophylaxis: PPI. Glucose control: SSI. Mobility: Bedrest. Code Status: Made DNR by neurology 08/27/2018 Family Communication: 08/29/2018 no family at bedside Disposition: ICU.  Labs   CBC: Recent Labs  Lab 09/02/2018 2217 08/26/18 0156 08/27/18 0401 08/27/18 0432 08/28/18 0411 08/29/18 0336  WBC 5.9  --   --  11.7* 13.7* 16.3*  NEUTROABS 2.8  --   --   --   --   --   HGB 12.0 13.9 11.2* 10.0* 9.9* 9.5*  HCT 41.2 41.0 33.0* 32.7* 32.3* 30.7*  MCV 71.9*  --   --  71.2* 71.6* 70.7*  PLT 160  --   --  137* 136* 136*   Basic Metabolic Panel: Recent Labs  Lab 08/11/2018 2217 08/06/2018 2223  08/26/18 1710 08/27/18 0401 08/27/18 0432 08/27/18 1700 08/28/18 0411 08/29/18 0336 08/30/18 0447  NA 135  --    < >  --  139 138  --  137 139 142  K 5.0  --    < >  --  3.3* 3.2*  --  4.0 3.8 3.5  CL 99  --   --   --   --  106  --  107 110 108  CO2 28  --   --   --   --  24  --  22 23 24   GLUCOSE 331*  --   --   --   --  171*  --  221* 158* 97  BUN 8  --   --   --   --  13  --  21 23 21   CREATININE 1.24* 1.20*  --   --   --  0.75  --  0.70 0.70 0.59  CALCIUM 8.9  --   --   --   --  8.5*  --  8.9 8.9 9.1  MG  --   --   --  1.7  --  1.8 1.8 1.9  --  1.9  PHOS  --   --   --  3.2  --  3.5 3.0 3.4  --  2.9   < > = values in this interval not displayed.   GFR: Estimated Creatinine Clearance: 60.7 mL/min (by C-G formula based on SCr of 0.59 mg/dL).  Recent Labs  Lab 08/17/2018 2217 08/27/18 0432 08/28/18 0411 08/29/18 0336  WBC 5.9 11.7* 13.7* 16.3*   Liver Function Tests: Recent Labs  Lab 08/13/2018 2217 08/28/18 0411  AST 27 15  ALT 13 9  ALKPHOS 93 54  BILITOT 1.0 0.6  PROT 7.0 5.4*  ALBUMIN 3.8 2.6*   No results for input(s): LIPASE, AMYLASE in the last 168 hours. No results for input(s): AMMONIA in the last 168 hours. ABG    Component Value Date/Time   PHART 7.384 08/27/2018 0401   PCO2ART 44.1 08/27/2018 0401   PO2ART 188.0 (H) 08/27/2018 0401   HCO3 26.3 08/27/2018 0401   TCO2 28 08/27/2018 0401   O2SAT 100.0 08/27/2018 0401    Coagulation Profile: Recent Labs  Lab 08/09/2018 2300  INR 1.0   Cardiac Enzymes: No results for input(s): CKTOTAL, CKMB, CKMBINDEX, TROPONINI in the last 168 hours. HbA1C: Hgb A1c MFr Bld  Date/Time Value Ref Range Status  08/26/2018 05:44 AM 10.4 (H) 4.8 - 5.6 % Final    Comment:    (NOTE) Pre diabetes:          5.7%-6.4% Diabetes:              >6.4% Glycemic control for   <7.0% adults with diabetes    CBG: Recent Labs  Lab 08/30/18 1520 08/30/18 1926 08/30/18 2346 08/31/18 0320 08/31/18 0728  GLUCAP 113* 193* 136* 95 91    This patient is critically ill with multiple organ system failure; which, requires frequent high complexity decision making, assessment, support, evaluation, and titration of therapies. This was completed through the application of advanced monitoring technologies and extensive interpretation of multiple databases. During this encounter critical care time was devoted to patient care services described in this note for 32 minutes.   Josephine Igo, DO [facility] 08/31/2018 9:15 AM  Personal pager: [phone] If unanswered, please page CCM On-call: #[phone]

## 2018-08-31 NOTE — Progress Notes (Addendum)
STROKE TEAM PROGRESS NOTE   INTERVAL HISTORY Patient still intubated, on sedation this morning due to agitation.  Eyes open on voice, following minimal commands stick tongue out to command.  Moving all extremities on pain stimulation, left more than right. Dr. Erlinda Hong with CCM yesterday and would like to have palliative care involved for [place] discussion.   Vitals:   08/31/18 0630 08/31/18 0700 08/31/18 0800 08/31/18 0832  BP: 139/77 (!) 154/51 (!) 162/61   Pulse: 75 81 84 85  Resp: '17 17 17 ' (!) 21  Temp:      TempSrc:      SpO2: 100% 100% 100%   Weight:      Height:       CBG (last 3)  Recent Labs    08/30/18 2346 08/31/18 0320 08/31/18 0728  GLUCAP 136* 95 91    CBC:  Recent Labs  Lab 08/15/2018 2217  08/28/18 0411 08/29/18 0336  WBC 5.9   < > 13.7* 16.3*  NEUTROABS 2.8  --   --   --   HGB 12.0   < > 9.9* 9.5*  HCT 41.2   < > 32.3* 30.7*  MCV 71.9*   < > 71.6* 70.7*  PLT 160   < > 136* 136*   < > = values in this interval not displayed.    Basic Metabolic Panel:  Recent Labs  Lab 08/28/18 0411 08/29/18 0336 08/30/18 0447  NA 137 139 142  K 4.0 3.8 3.5  CL 107 110 108  CO2 '22 23 24  ' GLUCOSE 221* 158* 97  BUN '21 23 21  ' CREATININE 0.70 0.70 0.59  CALCIUM 8.9 8.9 9.1  MG 1.9  --  1.9  PHOS 3.4  --  2.9   Lipid Panel:     Component Value Date/Time   CHOL 178 08/26/2018 0500   TRIG 191 (H) 08/26/2018 0500   HDL 38 (L) 08/26/2018 0500   CHOLHDL 4.7 08/26/2018 0500   VLDL 38 08/26/2018 0500   LDLCALC 102 (H) 08/26/2018 0500   HgbA1c:  Lab Results  Component Value Date   HGBA1C 10.4 (H) 08/26/2018   Urine Drug Screen: No results found for: LABOPIA, COCAINSCRNUR, LABBENZ, AMPHETMU, THCU, LABBARB  Alcohol Level     Component Value Date/Time   ETH <10 08/10/2018 2225    IMAGING  Ct Head Wo Contrast Result Date: 08/26/2018 CLINICAL DATA:  Status post tPA with intracranial hemorrhage. EXAM: CT HEAD WITHOUT CONTRAST TECHNIQUE: Contiguous axial images were  obtained from the base of the skull through the vertex without intravenous contrast. COMPARISON:  Head CT 08/26/2018 FINDINGS: Brain: Allowing for the a small amount of redistribution, the size and distribution of hemorrhagic sites within the brain are unchanged. There is no midline shift or other mass effect. Size and configuration of the ventricles are unchanged. Vascular: No abnormal hyperdensity of the major intracranial arteries or dural venous sinuses. No intracranial atherosclerosis. Skull: The visualized skull base, calvarium and extracranial soft tissues are normal. Sinuses/Orbits: No fluid levels or advanced mucosal thickening of the visualized paranasal sinuses. No mastoid or middle ear effusion. The orbits are normal. IMPRESSION: Unchanged size and distribution of hemorrhagic foci within the brain. Electronically Signed   By: Ulyses Jarred M.D.   On: 08/26/2018 23:31   Ct Head Wo Contrast  Result Date: 08/26/2018 CLINICAL DATA:  Intracranial hemorrhage follow up, status post tPA EXAM: CT HEAD WITHOUT CONTRAST TECHNIQUE: Contiguous axial images were obtained from the base of the skull through the  vertex without intravenous contrast. COMPARISON:  08/26/2018 FINDINGS: Brain: Intracranial hemorrhage has worsened since the prior scan. There is a new focus of intraparenchymal hemorrhage in the right temporal lobe that measures 3.3 x 1.5 x 1.6 cm (volume = 4.1 cm^3). Bilateral occipital lobe hemorrhages are slightly larger. Distribution of subarachnoid blood is unchanged. No midline shift or other mass effect. Vascular: No abnormal hyperdensity of the major intracranial arteries or dural venous sinuses. No intracranial atherosclerosis. Skull: The visualized skull base, calvarium and extracranial soft tissues are normal. Sinuses/Orbits: No fluid levels or advanced mucosal thickening of the visualized paranasal sinuses. No mastoid or middle ear effusion. The orbits are normal. IMPRESSION: 1. Worsening  intracranial hemorrhage, most notably within the right temporal lobe, where there is a new intraparenchymal hematoma that measures 4.1 mL. 2. Slightly increased size of multiple other small foci of hemorrhage with unchanged distribution of subarachnoid blood. 3. No midline shift or other mass effect. 4. Critical Value/emergent results were called by telephone at the time of interpretation on 08/26/2018 at 1:54 am to Dr. Karena Addison Aroor, who verbally acknowledged these results. Electronically Signed   By: Ulyses Jarred M.D.   On: 08/26/2018 01:55   Ct Head Wo Contrast  Addendum Date: 08/26/2018   ADDENDUM REPORT: 08/26/2018 00:37 ADDENDUM: Critical Value/emergent results were called by telephone at the time of interpretation on 08/26/2018 at 12:36 am to Dr. Karena Addison Aroor , who verbally acknowledged these results. Electronically Signed   By: Ulyses Jarred M.D.   On: 08/26/2018 00:37   Result Date: 08/26/2018 CLINICAL DATA:  Status post tPA EXAM: CT HEAD WITHOUT CONTRAST TECHNIQUE: Contiguous axial images were obtained from the base of the skull through the vertex without intravenous contrast. COMPARISON:  None. FINDINGS: Brain: There is multifocal subarachnoid and parenchymal hemorrhage within both hemispheres. Largest area hemorrhages in the left frontal lobe, measuring approximately 1.6 x 1.0 x 1.6 cm (volume = 1.3 cm^3). Smaller foci of hemorrhage are located within both occipital lobes. The subarachnoid blood is greatest over the right parietal convexity. No midline shift or other mass effect. There is periventricular hypoattenuation compatible with chronic microvascular disease. Vascular: No abnormal hyperdensity of the major intracranial arteries or dural venous sinuses. No intracranial atherosclerosis. Skull: The visualized skull base, calvarium and extracranial soft tissues are normal. Sinuses/Orbits: No fluid levels or advanced mucosal thickening of the visualized paranasal sinuses. No mastoid or middle  ear effusion. The orbits are normal. IMPRESSION: Multifocal acute intraparenchymal and subarachnoid hemorrhage involving both hemispheres, greatest in the left frontal lobe. No associated mass effect. Electronically Signed: By: Ulyses Jarred M.D. On: 08/26/2018 00:28   Mr Brain Wo Contrast  Result Date: 08/26/2018 CLINICAL DATA:  LEFT brain infarct status post tPA with resultant intracranial hemorrhage. EXAM: MRI HEAD WITHOUT CONTRAST MRA HEAD WITHOUT CONTRAST TECHNIQUE: Multiplanar, multiecho pulse sequences of the brain and surrounding structures were obtained without intravenous contrast. Angiographic images of the head were obtained using MRA technique without contrast. COMPARISON:  Prior CT exams, most recent earlier today at 1:33 a.m. Most recent prior MR 02/10/2018. FINDINGS: MRI HEAD FINDINGS Brain: Apparent increase in size and number of multiple intracranial hemorrhages of an acute nature. Largest index lesion, measured on susceptibility weighting series 10, is 2 x 4 cm, increased from 1.5 x 3 cm earlier. Similar increase in medial RIGHT occipital lesion now 2 cm diameter, previous less than 1.5 cm. There is a posterior predominance of lesions, in the RIGHT greater than LEFT occipital and posterior parietal lobe, RIGHT  greater than LEFT temporal lobe, raising the question of PRES. Numerous punctate foci of hemosiderin deposition were present on the previous MR from September 2019, and the imaging findings could represent manifestation of underlying hypertensive cerebrovascular disease versus cerebral amyloid angiopathy, complicated by post tPA hemorrhage. Baseline atrophy and small vessel disease. No extra-axial collections. Vascular: Reported separately. Skull and upper cervical spine: Normal marrow signal. Sinuses/Orbits: Noncontributory Other: None MRA HEAD FINDINGS The internal carotid arteries are widely patent. The basilar artery is widely patent with vertebrals codominant. Fetal RIGHT PCA. No  proximal intracranial stenosis or saccular aneurysm. No visible changes of vasculitis or vasospasm. IMPRESSION: Increase in both size and number of multiple intracranial hemorrhages of an acute nature. Considerations include hypertensive cerebrovascular disease, cerebral amyloid angiopathy, complication of tPA, or hemorrhagic PRES. MRA of the intracranial circulation demonstrates no flow-limiting stenosis, large vessel vasculitis, vasospasm, or other significant finding. These results were called by telephone at the time of interpretation on 08/26/2018 at 3:25 pm to Dr. Erlinda Hong , who verbally acknowledged these results. Electronically Signed   By: Staci Righter M.D.   On: 08/26/2018 15:32   Dg Chest Port 1 View  Result Date: 08/27/2018 CLINICAL DATA:  Respiratory failure.  ET tube placement. EXAM: PORTABLE CHEST 1 VIEW COMPARISON:  08/26/2018 FINDINGS: Normal heart size. Aortic atherosclerosis. The endotracheal tube tip is 1.5 cm above the carina. There is an enteric 2 with tip projecting over the expected location of the body of stomach. Side port is below the level of the GE junction. Small bilateral pleural effusions and mild pulmonary vascular congestion. Atelectasis is noted in both lung bases. IMPRESSION: Small bilateral pleural effusions and bibasilar atelectasis. Electronically Signed   By: Kerby Moors M.D.   On: 08/27/2018 08:02   Dg Chest Port 1 View  Result Date: 08/26/2018 CLINICAL DATA:  Intubation. EXAM: PORTABLE CHEST 1 VIEW COMPARISON:  02/14/2018 FINDINGS: Endotracheal tube tip slightly low in positioning 16 mm from the carina. Tip and side port of the enteric tube below the diaphragm in the stomach. Low lung volumes. Unchanged heart size and mediastinal contours with aortic tortuosity. Bibasilar atelectasis. No evidence of pulmonary edema, large pleural effusion or pneumothorax. Scattered peripheral fibrosis. IMPRESSION: 1. Endotracheal tube tip slightly low in positioning 16 mm from the  carina, consider retraction of 1-2 cm. Enteric tube in place. 2. Low lung volumes with bibasilar atelectasis. Electronically Signed   By: Keith Rake M.D.   On: 08/26/2018 00:50   Mr Virgel Paling HW Contrast  Result Date: 08/26/2018 CLINICAL DATA:  LEFT brain infarct status post tPA with resultant intracranial hemorrhage. EXAM: MRI HEAD WITHOUT CONTRAST MRA HEAD WITHOUT CONTRAST TECHNIQUE: Multiplanar, multiecho pulse sequences of the brain and surrounding structures were obtained without intravenous contrast. Angiographic images of the head were obtained using MRA technique without contrast. COMPARISON:  Prior CT exams, most recent earlier today at 1:33 a.m. Most recent prior MR 02/10/2018. FINDINGS: MRI HEAD FINDINGS Brain: Apparent increase in size and number of multiple intracranial hemorrhages of an acute nature. Largest index lesion, measured on susceptibility weighting series 10, is 2 x 4 cm, increased from 1.5 x 3 cm earlier. Similar increase in medial RIGHT occipital lesion now 2 cm diameter, previous less than 1.5 cm. There is a posterior predominance of lesions, in the RIGHT greater than LEFT occipital and posterior parietal lobe, RIGHT greater than LEFT temporal lobe, raising the question of PRES. Numerous punctate foci of hemosiderin deposition were present on the previous MR from September  2019, and the imaging findings could represent manifestation of underlying hypertensive cerebrovascular disease versus cerebral amyloid angiopathy, complicated by post tPA hemorrhage. Baseline atrophy and small vessel disease. No extra-axial collections. Vascular: Reported separately. Skull and upper cervical spine: Normal marrow signal. Sinuses/Orbits: Noncontributory Other: None MRA HEAD FINDINGS The internal carotid arteries are widely patent. The basilar artery is widely patent with vertebrals codominant. Fetal RIGHT PCA. No proximal intracranial stenosis or saccular aneurysm. No visible changes of  vasculitis or vasospasm. IMPRESSION: Increase in both size and number of multiple intracranial hemorrhages of an acute nature. Considerations include hypertensive cerebrovascular disease, cerebral amyloid angiopathy, complication of tPA, or hemorrhagic PRES. MRA of the intracranial circulation demonstrates no flow-limiting stenosis, large vessel vasculitis, vasospasm, or other significant finding. These results were called by telephone at the time of interpretation on 08/26/2018 at 3:25 pm to Dr. Erlinda Hong , who verbally acknowledged these results. Electronically Signed   By: Staci Righter M.D.   On: 08/26/2018 15:32   Ct Head Code Stroke Wo Contrast  Result Date: 08/24/2018 CLINICAL DATA:  Code stroke. Right-sided weakness. Last seen normal 21:30. EXAM: CT HEAD WITHOUT CONTRAST TECHNIQUE: Contiguous axial images were obtained from the base of the skull through the vertex without intravenous contrast. COMPARISON:  02/10/2018 FINDINGS: Brain: There is no mass, hemorrhage or extra-axial collection. The size and configuration of the ventricles and extra-axial CSF spaces are normal. There is hypoattenuation of the periventricular white matter, most commonly indicating chronic ischemic microangiopathy. Vascular: No abnormal hyperdensity of the major intracranial arteries or dural venous sinuses. No intracranial atherosclerosis. Skull: The visualized skull base, calvarium and extracranial soft tissues are normal. Sinuses/Orbits: No fluid levels or advanced mucosal thickening of the visualized paranasal sinuses. No mastoid or middle ear effusion. The orbits are normal. ASPECTS [facility] Stroke Program Early CT Score) - Ganglionic level infarction (caudate, lentiform nuclei, internal capsule, insula, M1-M3 cortex): 7 - Supraganglionic infarction (M4-M6 cortex): 3 Total score (0-10 with 10 being normal): 10 IMPRESSION: 1. No acute intracranial abnormality. 2. ASPECTS is 10. * These results were communicated to Dr. Karena Addison Aroor  at 10:31 pm on 08/16/2018 by text page via the [facility] messaging system. Electronically Signed   By: Ulyses Jarred M.D.   On: 08/21/2018 22:32   Carotid (at [place] Only)  Result Date: 08/26/2018 Carotid Arterial Duplex Study Indications: CVA. Limitations: Ventilator, Patient immobility, patient positioning Performing Technologist: Oliver Hum RVT  Examination Guidelines: A complete evaluation includes B-mode imaging, spectral Doppler, color Doppler, and power Doppler as needed of all accessible portions of each vessel. Bilateral testing is considered an integral part of a complete examination. Limited examinations for reoccurring indications may be performed as noted.  Right Carotid Findings: +----------+--------+--------+--------+---------------------+------------------+           PSV cm/sEDV cm/sStenosisDescribe             Comments           +----------+--------+--------+--------+---------------------+------------------+ CCA Prox  141     0               smooth and                                                                heterogenous                            +----------+--------+--------+--------+---------------------+------------------+  CCA [id number]     8               smooth and                                                                heterogenous                            +----------+--------+--------+--------+---------------------+------------------+ ICA Prox  82      7                                    High resistant                                                            flow               +----------+--------+--------+--------+---------------------+------------------+ ICA Distal75      9                                    High resistant                                                            flow               +----------+--------+--------+--------+---------------------+------------------+ ECA       71      0                                                        +----------+--------+--------+--------+---------------------+------------------+ +----------+--------+-------+--------+-------------------+           PSV cm/sEDV cmsDescribeArm Pressure (mmHG) +----------+--------+-------+--------+-------------------+ [id number]                                        +----------+--------+-------+--------+-------------------+ +---------+--------+--+--------+-+--------------+ VertebralPSV cm/s65EDV cm/s6High resistant +---------+--------+--+--------+-+--------------+  Left Carotid Findings: +----------+--------+--------+--------+---------------------+------------------+           PSV cm/sEDV cm/sStenosisDescribe             Comments           +----------+--------+--------+--------+---------------------+------------------+ CCA Prox  139     0                                    tortuous           +----------+--------+--------+--------+---------------------+------------------+ CCA Distal144     11  smooth and                                                                heterogenous                            +----------+--------+--------+--------+---------------------+------------------+ ICA Prox  93      7               smooth and           High resistant                                       heterogenous         flow               +----------+--------+--------+--------+---------------------+------------------+ ICA Distal124     8                                    High resistant                                                            flow               +----------+--------+--------+--------+---------------------+------------------+ ECA       91      0                                                       +----------+--------+--------+--------+---------------------+------------------+  +----------+--------+--------+--------+-------------------+ SubclavianPSV cm/sEDV cm/sDescribeArm Pressure (mmHG) +----------+--------+--------+--------+-------------------+           278                                         +----------+--------+--------+--------+-------------------+ +---------+--------+--+--------+-+--------------+ VertebralPSV cm/s96EDV cm/s7High resistant +---------+--------+--+--------+-+--------------+  Summary: Right Carotid: Velocities in the right ICA are consistent with a 1-39% stenosis.                High resistant waveforms are noted in the proximal and distal ICA                and the vertebral artery. Left Carotid: Velocities in the left ICA are consistent with a 1-39% stenosis.               High resistant waveforms are noted in the proximal and distal ICA               and the vertebral artery.  *See table(s) above for measurements and observations.     Preliminary    Dg Chest Port 1 View 08/28/2018 Slightly improved lung volume. No change in bibasilar atelectasis/pneumonia.    2D  Echocardiogram  1. The left ventricle has hyperdynamic systolic function, with an ejection fraction of >65%. The cavity size was normal. There is mildly increased left ventricular wall thickness. Left ventricular diastolic Doppler parameters are consistent with  impaired relaxation. Indeterminate filling pressures The E/e' is 8-15. No evidence of left ventricular regional wall motion abnormalities.  2. The right ventricle has normal systolic function. The cavity was normal. There is no increase in right ventricular wall thickness.  3. The mitral valve is grossly normal.  4. The tricuspid valve is grossly normal.  5. The aortic valve is tricuspid Mild sclerosis of the aortic valve.  6. The inferior vena cava was normal in size with <50% respiratory variability.   PHYSICAL EXAM Temp:  [97.9 F (36.6 C)-99.3 F (37.4 C)] 98.3 F (36.8 C) (03/29 0400) Pulse Rate:   [61-108] 85 (03/29 0832) Resp:  [13-24] 21 (03/29 0832) BP: (89-171)/(42-133) 162/61 (03/29 0800) SpO2:  [100 %] 100 % (03/29 0800) FiO2 (%):  [40 %] 40 % (03/29 0834) Weight:  [92.7 kg] 92.7 kg (03/29 0500)  General - Well nourished, well developed, intubated off sedation.  Ophthalmologic - fundi not visualized due to noncooperation.  Cardiovascular - Regular rate and rhythm.  Neuro - intubated off sedation, Eyes open on voice, following minimal commands such as stick tongue out to command. With eye opening, eyes in left gaze position then moved to mid position, not blinking to visual threat, doll's eyes present, not tracking, PERRL. Corneal reflex present bilaterally, gag and cough present. Breathing over the vent.  Facial symmetry not able to test due to ET tube.  Tongue midline in mouth. On pain stimulation, brisk movement on the left side, 3+/5 LUE and 2/5 RUE, as well as 3/5 LLE and and 2/5 RLE. DTR diminished and no babinski. Sensation, coordination and gait not tested.   ASSESSMENT/PLAN Ms. Caasi Giglia is a 78 y.o. female with history of DB, HTN, COPD presenting with R sided weakness, facial droop and slurred speech. BP 230 en route. CT neg. Received tPA 08/05/2018 at 2238. HA, mental status change and fluctuating BP post tPA. CT showed SAH and ICH. Reversed w/ TXA.   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 tPA reversed with TXA, source unclear, hemorrhages suspicious for underlying CAA Resultant intubated on sedation not following commands Stable neuro status with increased # hemorrhages    Code Stroke CT head 3/23 2225 No acute stroke. ASPECTS 10.   CT head 3/24 0006 multifocal B IPH and SAH. No mass effect  CT head  3/24 0144 worsening ICH w/ new R temporal hmg 48m. slightly increased size of other small ICH with unchanged SAH. No shift.  MRI 3/24 1458 stable in size and number of mult IC hmgs.  MRA  Unremarkable   CT head 3/24 2113 unchanged  size, distribution hmgs  Carotid Doppler B ICA 1-39% stenosis, VAs antegrade   2D Echo  EF > 65%. No source of embolus   LDL 102  HgbA1c 10.4  Fibrinogen 336 s/p TXA  SCDs for VTE prophylaxis  No antithrombotic prior to admission, now on No antithrombotic given post tPA hemorrhage   Started on Keppra 500 q 12h for seizure prophylaxis - will continue for total 7 days - end date adjusted under orders  Therapy recommendations:  pending   Disposition:  pending   Code status - DNR - daughter previously stated that pt would not want PEG or Trach.  Will have palliative  care involved for goals of care discussion  Hypertensive Emergency  BP 230 per EMS  Lowered w. labetalol for tPA administration   Required cleviprex post tPA for fluctuating BP control  Off Cleviprex now  SBP goal < 160 (adjusted all med orders)  On po BP meds with amlodipine 10, lisinopril 16m bid  Increase Coreg to 12.5 twice daily  Close monitoring  Acute Reparatory Failure  Unable to protect airway following ICH after tPA  Intubated in ED  On low dose propofol   CCM on board  Mental status precludes extubation   Family stated that pt would not want PEG or Trach  Fever Leukocytosis  Tmax 100.8-> afebrile  CXR small b/l pleural effusion and atelectasis b/l basilar   Repeat CXR 3/26 slightly improved lung volume. No change in bibasilar atelectasis/pneumonia.   WBC 11.7>13.7>16.3  CCM on board, appreciate CCM  Ice pack and tylenol PRN  Close monitoring  Dysaphgia  Secondary to stroke  NPO  On tube feeding @ 20  Resume some home meds via OG  SLP signed off d/t continued intubation, reorder when needed  Hyperlipidemia  Home meds:  No statin  LDL 102, goal < 70  Hold statin given hemorrhage  Consider statin at discharge based on plan of care  Diabetes type II  HgbA1c 10.4, goal < 7.0  Uncontrolled  Home DM Meds: Humalog 75/25 Insulin- 28 units AM/ 22 units  PM  On lantus 12 bid  novolog 3u q 4h as per DB RN recommends for TF coverage  CBG monitoring   Hyperglycemia improved  Mod SSI  CCM on board   Prognosis  Palliative Care met with family regarding poor prognosis on 3/28  Family needs more time to process situation  Pt currently DNR  Daughter previously stated that pt would not want PEG or Trach  Check labs in AM unless decision made to proceed with comfort measures   Other Stroke Risk Factors  Advanced age  Obesity, Body mass index is 36.2 kg/m., recommend weight loss, diet and exercise as appropriate   Other Active Problems  AKI, resolved Cr 1.2>0.70-> 0.59  COPD  Anemia, Hgb 13.9>11.2>10>9.9>9.5  Hypokalemia, resolved 3.8->3.5  Thrombocytopenia, stable PLT 136     Hospital day # 6   Very unfortunate 78year old female here with left brain infarct, TPA with resultant subarachnoid hemorrhage and intracranial hemorrhage, intubated, on sedation, following minimal commands, neuro status stable or slightly improved today and she will follow limited commands.  On Keppra for seizure prophylaxis.  She is DNR.  Palliative care involved due to likely poor outcome.  Other etiologies complicating her condition include hypertensive emergency, hypoglycemia, respiratory failure, at risk for neurological worsening due to hematoma expansion, recurrent stroke, cerebral edema, seizure or brain herniation. This patient is critically ill and at significant risk of neurological worsening, death and care requires constant monitoring of vital signs, hemodynamics,respiratory and cardiac monitoring,review of multiple databases, neurological assessment, discussion with family, other specialists and medical decision making of high complexity.I  I spent 30 minutes of neurocritical care time in the care of this patient.  ASarina Ill MD [facility]   To contact Stroke Continuity provider, please refer to  [email or web address] After hours, contact General Neurology

## 2018-09-01 DIAGNOSIS — Z515 Encounter for palliative care: Secondary | ICD-10-CM

## 2018-09-01 DIAGNOSIS — L8990 Pressure ulcer of unspecified site, unspecified stage: Secondary | ICD-10-CM

## 2018-09-01 DIAGNOSIS — R0689 Other abnormalities of breathing: Secondary | ICD-10-CM

## 2018-09-01 LAB — GLUCOSE, CAPILLARY
Glucose-Capillary: 98 mg/dL (ref 70–99)
Glucose-Capillary: 157 mg/dL — ABNORMAL HIGH (ref 70–99)
Glucose-Capillary: 106 mg/dL — ABNORMAL HIGH (ref 70–99)
Glucose-Capillary: 138 mg/dL — ABNORMAL HIGH (ref 70–99)
Glucose-Capillary: 109 mg/dL — ABNORMAL HIGH (ref 70–99)
Glucose-Capillary: 171 mg/dL — ABNORMAL HIGH (ref 70–99)

## 2018-09-01 LAB — CBC
HCT: 28.2 % — ABNORMAL LOW (ref 36.0–46.0)
Hemoglobin: 8.6 g/dL — ABNORMAL LOW (ref 12.0–15.0)
MCH: 22.3 pg — ABNORMAL LOW (ref 26.0–34.0)
MCHC: 30.5 g/dL (ref 30.0–36.0)
MCV: 73.1 fL — ABNORMAL LOW (ref 80.0–100.0)
Platelets: 171 10*3/uL (ref 150–400)
RBC: 3.86 MIL/uL — ABNORMAL LOW (ref 3.87–5.11)
RDW: 14.3 % (ref 11.5–15.5)
WBC: 13 10*3/uL — ABNORMAL HIGH (ref 4.0–10.5)
nRBC: 0 % (ref 0.0–0.2)

## 2018-09-01 LAB — BASIC METABOLIC PANEL
Anion gap: 6 (ref 5–15)
BUN: 26 mg/dL — AB (ref 8–23)
CALCIUM: 9 mg/dL (ref 8.9–10.3)
CO2: 29 mmol/L (ref 22–32)
Chloride: 110 mmol/L (ref 98–111)
Creatinine, Ser: 0.69 mg/dL (ref 0.44–1.00)
GFR calc Af Amer: >60 mL/min (ref >60)
GFR calc non Af Amer: >60 mL/min (ref >60)
Glucose, Bld: 153 mg/dL — ABNORMAL HIGH (ref 70–99)
Potassium: 3.3 mmol/L — ABNORMAL LOW (ref 3.5–5.1)
Sodium: 145 mmol/L (ref 135–145)

## 2018-09-01 LAB — TRIGLYCERIDES: Triglycerides: 61 mg/dL (ref <150)

## 2018-09-01 MED ORDER — POTASSIUM CHLORIDE 20 MEQ/15ML (10%) PO SOLN
40.0000 meq | Freq: Once | ORAL | Status: AC
  Administered 2018-09-01: 40 meq

## 2018-09-01 MED ORDER — PRO-STAT SUGAR FREE PO LIQD
30.0000 mL | Freq: Every day | ORAL | Status: DC
  Administered 2018-09-02: 30 mL
  Filled 2018-09-01: qty 30

## 2018-09-01 MED ORDER — POTASSIUM CHLORIDE 20 MEQ/15ML (10%) PO SOLN
ORAL | Status: AC
  Filled 2018-09-01: qty 30

## 2018-09-01 MED ORDER — VITAL HIGH PROTEIN PO LIQD
1000.0000 mL | ORAL | Status: DC
  Administered 2018-09-01 – 2018-09-02 (×2): 1000 mL

## 2018-09-01 NOTE — Evaluation (Signed)
Occupational Therapy Evaluation Patient Details Name: Beth Pope MRN: [id number] DOB: Sep 09, 1940 Today's Date: 09/01/2018    History of Present Illness Beth Pope is an 78 y.o. female past medical history of diabetes mellitus, COPD, hypertension presents to the emergency room as a code stroke for sudden onset right-sided weakness, facial droop and slurred speech.  TPA given, but later pt showed to be having neurological symptoms.  CT then showed multiple focal SAH without midline shift.  Pt was intubated.  She remains intubated via ETT.  Palliative consulted.   Clinical Impression   This 78 yo female admitted with above presents to acute OT with following one step commands intermittently with as much as a 20 second delay and moving Bil UEs also spontaneously as well as LLE and trunk while seated EOB. Minimal eye opening so unsure of vision at this time. She will continue to benefit from acute OT with follow up on SNF to work towards a more independent level.     Follow Up Recommendations  SNF;Supervision/Assistance - 24 hour    Equipment Recommendations  Other (comment)(TBD next venue)       Precautions / Restrictions Precautions Precaution Comments: intubated by ETT Restrictions Weight Bearing Restrictions: No      Mobility Bed Mobility Overal bed mobility: Needs Assistance Bed Mobility: Supine to Sit;Sit to Supine     Supine to sit: Total assist;+2 for physical assistance Sit to supine: Total assist;+2 for physical assistance   General bed mobility comments: pt is moving, but not purposefully to assist  Transfers                 General transfer comment: not attempted due to pt is restless and will be imminently video conferencing with her family    Balance Overall balance assessment: Needs assistance Sitting-balance support: Single extremity supported;Bilateral upper extremity supported Sitting balance-Leahy Scale: Zero Sitting balance - Comments: pt  with minimal truncal control.  Given support she can grossly extend or flex trunK.  With significant stability and cues she moves bil UE's and L LE intermittently to cuing with a 5 to 20 sec delay.                                   ADL either performed or assessed with clinical judgement   ADL                                         General ADL Comments: total A     Vision   Additional Comments: Only opened eyes barely a couple of times when asked and with increased time to do so            Pertinent Vitals/Pain Pain Assessment: Faces Faces Pain Scale: No hurt     Hand Dominance  unknown   Extremity/Trunk Assessment Upper Extremity Assessment Upper Extremity Assessment: ( Moves both UEs spontaneously. Moved intermitently on command with as longs as 20 second delay. Reached up with RUE to touch nose spontaneously)           Communication Communication Communication: Other (comment)(intubated)   Cognition Arousal/Alertness: Awake/alert;Lethargic Behavior During Therapy: Restless Overall Cognitive Status: Impaired/Different from baseline Area of Impairment: Following commands;Awareness;Problem solving  Following Commands: Follows one step commands with increased time;Follows one step commands inconsistently(5-20 secs)   Awareness: Intellectual Problem Solving: Slow processing General Comments: Pt needs cues for attention then time to follow commands though inconsistent      Exercises Exercises: Other exercises Other Exercises Other Exercises: completed basic PROM/AAROM to all 4 extremities as warm up.     Home Living Family/patient expects to be discharged to:: Unsure                                 Additional Comments: No family present.  Palliative spoke to pt's dtr who states pt is outgoing and tough, "loves to cook"  No home environment or PLOF related in conversation                OT Problem List: Decreased strength;Decreased range of motion;Decreased activity tolerance;Impaired balance (sitting and/or standing);Impaired vision/perception;Decreased coordination;Decreased cognition;Decreased safety awareness;Decreased knowledge of use of DME or AE;Cardiopulmonary status limiting activity;Impaired tone;Obesity;Impaired UE functional use      OT Treatment/Interventions: Self-care/ADL training;Balance training;Therapeutic exercise;Neuromuscular education;Therapeutic activities;Visual/perceptual remediation/compensation;Cognitive remediation/compensation;Patient/family education    OT Goals(Current goals can be found in the care plan section) Acute Rehab OT Goals Patient Stated Goal: unable--ETT OT Goal Formulation: Patient unable to participate in goal setting Time For Goal Achievement: 09/15/18 Potential to Achieve Goals: Good  OT Frequency: Min 2X/week   Barriers to D/C: Decreased caregiver support          Co-evaluation PT/OT/SLP Co-Evaluation/Treatment: Yes Reason for Co-Treatment: Complexity of the patient's impairments (multi-system involvement) PT goals addressed during session: Mobility/safety with mobility;Strengthening/ROM OT goals addressed during session: Strengthening/ROM      AM-PAC OT "6 Clicks" Daily Activity     Outcome Measure Help from another person eating meals?: Total Help from another person taking care of personal grooming?: Total Help from another person toileting, which includes using toliet, bedpan, or urinal?: Total Help from another person bathing (including washing, rinsing, drying)?: Total Help from another person to put on and taking off regular upper body clothing?: Total Help from another person to put on and taking off regular lower body clothing?: Total 6 Click Score: 6   End of Session Nurse Communication: (how pt did with [place] following commands)  Activity Tolerance: Patient limited by lethargy Patient left: in  bed;with call bell/phone within reach;with bed alarm set  OT Visit Diagnosis: Other abnormalities of gait and mobility (R26.89);Muscle weakness (generalized) (M62.81);Other symptoms and signs involving cognitive function;Cognitive communication deficit (R41.841);Hemiplegia and hemiparesis Symptoms and signs involving cognitive functions: 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

## 2018-09-01 NOTE — Progress Notes (Signed)
STROKE TEAM PROGRESS NOTE   INTERVAL HISTORY Patient remains intubated and on propofol sedation Eyes open on voice, following minimal commands stick tongue out to command.  Moving all extremities on pain stimulation, left more than right.   palliative care involved for [place] discussion.   Vitals:   09/01/18 1000 09/01/18 1115 09/01/18 1118 09/01/18 1200  BP: (!) 145/51     Pulse: 74     Resp: 18     Temp:    98.6 F (37 C)  TempSrc:    Axillary  SpO2: 100% 100% 100%   Weight:      Height:       CBG (last 3)  Recent Labs    09/01/18 0317 09/01/18 0801 09/01/18 1234  GLUCAP 98 157* 106*    CBC:  Recent Labs  Lab 08/20/2018 2217  08/29/18 0336 09/01/18 0120  WBC 5.9   < > 16.3* 13.0*  NEUTROABS 2.8  --   --   --   HGB 12.0   < > 9.5* 8.6*  HCT 41.2   < > 30.7* 28.2*  MCV 71.9*   < > 70.7* 73.1*  PLT 160   < > 136* 171   < > = values in this interval not displayed.    Basic Metabolic Panel:  Recent Labs  Lab 08/28/18 0411  08/30/18 0447 09/01/18 0120  NA 137   < > 142 145  K 4.0   < > 3.5 3.3*  CL 107   < > 108 110  CO2 22   < > 24 29  GLUCOSE 221*   < > 97 153*  BUN 21   < > 21 26*  CREATININE 0.70   < > 0.59 0.69  CALCIUM 8.9   < > 9.1 9.0  MG 1.9  --  1.9  --   PHOS 3.4  --  2.9  --    < > = values in this interval not displayed.   Lipid Panel:     Component Value Date/Time   CHOL 178 08/26/2018 0500   TRIG 61 09/01/2018 0120   HDL 38 (L) 08/26/2018 0500   CHOLHDL 4.7 08/26/2018 0500   VLDL 38 08/26/2018 0500   LDLCALC 102 (H) 08/26/2018 0500   HgbA1c:  Lab Results  Component Value Date   HGBA1C 10.4 (H) 08/26/2018   Urine Drug Screen: No results found for: LABOPIA, COCAINSCRNUR, LABBENZ, AMPHETMU, THCU, LABBARB  Alcohol Level     Component Value Date/Time   ETH <10 08/20/2018 2225    IMAGING  Ct Head Wo Contrast Result Date: 08/26/2018 CLINICAL DATA:  Status post tPA with intracranial hemorrhage. EXAM: CT HEAD WITHOUT CONTRAST TECHNIQUE:  Contiguous axial images were obtained from the base of the skull through the vertex without intravenous contrast. COMPARISON:  Head CT 08/26/2018 FINDINGS: Brain: Allowing for the a small amount of redistribution, the size and distribution of hemorrhagic sites within the brain are unchanged. There is no midline shift or other mass effect. Size and configuration of the ventricles are unchanged. Vascular: No abnormal hyperdensity of the major intracranial arteries or dural venous sinuses. No intracranial atherosclerosis. Skull: The visualized skull base, calvarium and extracranial soft tissues are normal. Sinuses/Orbits: No fluid levels or advanced mucosal thickening of the visualized paranasal sinuses. No mastoid or middle ear effusion. The orbits are normal. IMPRESSION: Unchanged size and distribution of hemorrhagic foci within the brain. Electronically Signed   By: Ulyses Jarred M.D.   On: 08/26/2018 23:31  Ct Head Wo Contrast  Result Date: 08/26/2018 CLINICAL DATA:  Intracranial hemorrhage follow up, status post tPA EXAM: CT HEAD WITHOUT CONTRAST TECHNIQUE: Contiguous axial images were obtained from the base of the skull through the vertex without intravenous contrast. COMPARISON:  08/26/2018 FINDINGS: Brain: Intracranial hemorrhage has worsened since the prior scan. There is a new focus of intraparenchymal hemorrhage in the right temporal lobe that measures 3.3 x 1.5 x 1.6 cm (volume = 4.1 cm^3). Bilateral occipital lobe hemorrhages are slightly larger. Distribution of subarachnoid blood is unchanged. No midline shift or other mass effect. Vascular: No abnormal hyperdensity of the major intracranial arteries or dural venous sinuses. No intracranial atherosclerosis. Skull: The visualized skull base, calvarium and extracranial soft tissues are normal. Sinuses/Orbits: No fluid levels or advanced mucosal thickening of the visualized paranasal sinuses. No mastoid or middle ear effusion. The orbits are normal.  IMPRESSION: 1. Worsening intracranial hemorrhage, most notably within the right temporal lobe, where there is a new intraparenchymal hematoma that measures 4.1 mL. 2. Slightly increased size of multiple other small foci of hemorrhage with unchanged distribution of subarachnoid blood. 3. No midline shift or other mass effect. 4. Critical Value/emergent results were called by telephone at the time of interpretation on 08/26/2018 at 1:54 am to Dr. Karena Addison Aroor, who verbally acknowledged these results. Electronically Signed   By: Ulyses Jarred M.D.   On: 08/26/2018 01:55   Ct Head Wo Contrast  Addendum Date: 08/26/2018   ADDENDUM REPORT: 08/26/2018 00:37 ADDENDUM: Critical Value/emergent results were called by telephone at the time of interpretation on 08/26/2018 at 12:36 am to Dr. Karena Addison Aroor , who verbally acknowledged these results. Electronically Signed   By: Ulyses Jarred M.D.   On: 08/26/2018 00:37   Result Date: 08/26/2018 CLINICAL DATA:  Status post tPA EXAM: CT HEAD WITHOUT CONTRAST TECHNIQUE: Contiguous axial images were obtained from the base of the skull through the vertex without intravenous contrast. COMPARISON:  None. FINDINGS: Brain: There is multifocal subarachnoid and parenchymal hemorrhage within both hemispheres. Largest area hemorrhages in the left frontal lobe, measuring approximately 1.6 x 1.0 x 1.6 cm (volume = 1.3 cm^3). Smaller foci of hemorrhage are located within both occipital lobes. The subarachnoid blood is greatest over the right parietal convexity. No midline shift or other mass effect. There is periventricular hypoattenuation compatible with chronic microvascular disease. Vascular: No abnormal hyperdensity of the major intracranial arteries or dural venous sinuses. No intracranial atherosclerosis. Skull: The visualized skull base, calvarium and extracranial soft tissues are normal. Sinuses/Orbits: No fluid levels or advanced mucosal thickening of the visualized paranasal  sinuses. No mastoid or middle ear effusion. The orbits are normal. IMPRESSION: Multifocal acute intraparenchymal and subarachnoid hemorrhage involving both hemispheres, greatest in the left frontal lobe. No associated mass effect. Electronically Signed: By: Ulyses Jarred M.D. On: 08/26/2018 00:28   Mr Brain Wo Contrast  Result Date: 08/26/2018 CLINICAL DATA:  LEFT brain infarct status post tPA with resultant intracranial hemorrhage. EXAM: MRI HEAD WITHOUT CONTRAST MRA HEAD WITHOUT CONTRAST TECHNIQUE: Multiplanar, multiecho pulse sequences of the brain and surrounding structures were obtained without intravenous contrast. Angiographic images of the head were obtained using MRA technique without contrast. COMPARISON:  Prior CT exams, most recent earlier today at 1:33 a.m. Most recent prior MR 02/10/2018. FINDINGS: MRI HEAD FINDINGS Brain: Apparent increase in size and number of multiple intracranial hemorrhages of an acute nature. Largest index lesion, measured on susceptibility weighting series 10, is 2 x 4 cm, increased from 1.5 x 3  cm earlier. Similar increase in medial RIGHT occipital lesion now 2 cm diameter, previous less than 1.5 cm. There is a posterior predominance of lesions, in the RIGHT greater than LEFT occipital and posterior parietal lobe, RIGHT greater than LEFT temporal lobe, raising the question of PRES. Numerous punctate foci of hemosiderin deposition were present on the previous MR from September 2019, and the imaging findings could represent manifestation of underlying hypertensive cerebrovascular disease versus cerebral amyloid angiopathy, complicated by post tPA hemorrhage. Baseline atrophy and small vessel disease. No extra-axial collections. Vascular: Reported separately. Skull and upper cervical spine: Normal marrow signal. Sinuses/Orbits: Noncontributory Other: None MRA HEAD FINDINGS The internal carotid arteries are widely patent. The basilar artery is widely patent with vertebrals  codominant. Fetal RIGHT PCA. No proximal intracranial stenosis or saccular aneurysm. No visible changes of vasculitis or vasospasm. IMPRESSION: Increase in both size and number of multiple intracranial hemorrhages of an acute nature. Considerations include hypertensive cerebrovascular disease, cerebral amyloid angiopathy, complication of tPA, or hemorrhagic PRES. MRA of the intracranial circulation demonstrates no flow-limiting stenosis, large vessel vasculitis, vasospasm, or other significant finding. These results were called by telephone at the time of interpretation on 08/26/2018 at 3:25 pm to Dr. Erlinda Hong , who verbally acknowledged these results. Electronically Signed   By: Staci Righter M.D.   On: 08/26/2018 15:32   Dg Chest Port 1 View  Result Date: 08/27/2018 CLINICAL DATA:  Respiratory failure.  ET tube placement. EXAM: PORTABLE CHEST 1 VIEW COMPARISON:  08/26/2018 FINDINGS: Normal heart size. Aortic atherosclerosis. The endotracheal tube tip is 1.5 cm above the carina. There is an enteric 2 with tip projecting over the expected location of the body of stomach. Side port is below the level of the GE junction. Small bilateral pleural effusions and mild pulmonary vascular congestion. Atelectasis is noted in both lung bases. IMPRESSION: Small bilateral pleural effusions and bibasilar atelectasis. Electronically Signed   By: Kerby Moors M.D.   On: 08/27/2018 08:02   Dg Chest Port 1 View  Result Date: 08/26/2018 CLINICAL DATA:  Intubation. EXAM: PORTABLE CHEST 1 VIEW COMPARISON:  02/14/2018 FINDINGS: Endotracheal tube tip slightly low in positioning 16 mm from the carina. Tip and side port of the enteric tube below the diaphragm in the stomach. Low lung volumes. Unchanged heart size and mediastinal contours with aortic tortuosity. Bibasilar atelectasis. No evidence of pulmonary edema, large pleural effusion or pneumothorax. Scattered peripheral fibrosis. IMPRESSION: 1. Endotracheal tube tip slightly low in  positioning 16 mm from the carina, consider retraction of 1-2 cm. Enteric tube in place. 2. Low lung volumes with bibasilar atelectasis. Electronically Signed   By: Keith Rake M.D.   On: 08/26/2018 00:50   Mr Virgel Paling MP Contrast  Result Date: 08/26/2018 CLINICAL DATA:  LEFT brain infarct status post tPA with resultant intracranial hemorrhage. EXAM: MRI HEAD WITHOUT CONTRAST MRA HEAD WITHOUT CONTRAST TECHNIQUE: Multiplanar, multiecho pulse sequences of the brain and surrounding structures were obtained without intravenous contrast. Angiographic images of the head were obtained using MRA technique without contrast. COMPARISON:  Prior CT exams, most recent earlier today at 1:33 a.m. Most recent prior MR 02/10/2018. FINDINGS: MRI HEAD FINDINGS Brain: Apparent increase in size and number of multiple intracranial hemorrhages of an acute nature. Largest index lesion, measured on susceptibility weighting series 10, is 2 x 4 cm, increased from 1.5 x 3 cm earlier. Similar increase in medial RIGHT occipital lesion now 2 cm diameter, previous less than 1.5 cm. There is a posterior predominance of  lesions, in the RIGHT greater than LEFT occipital and posterior parietal lobe, RIGHT greater than LEFT temporal lobe, raising the question of PRES. Numerous punctate foci of hemosiderin deposition were present on the previous MR from September 2019, and the imaging findings could represent manifestation of underlying hypertensive cerebrovascular disease versus cerebral amyloid angiopathy, complicated by post tPA hemorrhage. Baseline atrophy and small vessel disease. No extra-axial collections. Vascular: Reported separately. Skull and upper cervical spine: Normal marrow signal. Sinuses/Orbits: Noncontributory Other: None MRA HEAD FINDINGS The internal carotid arteries are widely patent. The basilar artery is widely patent with vertebrals codominant. Fetal RIGHT PCA. No proximal intracranial stenosis or saccular aneurysm. No  visible changes of vasculitis or vasospasm. IMPRESSION: Increase in both size and number of multiple intracranial hemorrhages of an acute nature. Considerations include hypertensive cerebrovascular disease, cerebral amyloid angiopathy, complication of tPA, or hemorrhagic PRES. MRA of the intracranial circulation demonstrates no flow-limiting stenosis, large vessel vasculitis, vasospasm, or other significant finding. These results were called by telephone at the time of interpretation on 08/26/2018 at 3:25 pm to Dr. Erlinda Hong , who verbally acknowledged these results. Electronically Signed   By: Staci Righter M.D.   On: 08/26/2018 15:32   Ct Head Code Stroke Wo Contrast  Result Date: 08/09/2018 CLINICAL DATA:  Code stroke. Right-sided weakness. Last seen normal 21:30. EXAM: CT HEAD WITHOUT CONTRAST TECHNIQUE: Contiguous axial images were obtained from the base of the skull through the vertex without intravenous contrast. COMPARISON:  02/10/2018 FINDINGS: Brain: There is no mass, hemorrhage or extra-axial collection. The size and configuration of the ventricles and extra-axial CSF spaces are normal. There is hypoattenuation of the periventricular white matter, most commonly indicating chronic ischemic microangiopathy. Vascular: No abnormal hyperdensity of the major intracranial arteries or dural venous sinuses. No intracranial atherosclerosis. Skull: The visualized skull base, calvarium and extracranial soft tissues are normal. Sinuses/Orbits: No fluid levels or advanced mucosal thickening of the visualized paranasal sinuses. No mastoid or middle ear effusion. The orbits are normal. ASPECTS [facility] Stroke Program Early CT Score) - Ganglionic level infarction (caudate, lentiform nuclei, internal capsule, insula, M1-M3 cortex): 7 - Supraganglionic infarction (M4-M6 cortex): 3 Total score (0-10 with 10 being normal): 10 IMPRESSION: 1. No acute intracranial abnormality. 2. ASPECTS is 10. * These results were communicated to  Dr. Karena Addison Aroor at 10:31 pm on 08/24/2018 by text page via the [facility] messaging system. Electronically Signed   By: Ulyses Jarred M.D.   On: 08/23/2018 22:32   Carotid (at [place] Only)  Result Date: 08/26/2018 Carotid Arterial Duplex Study Indications: CVA. Limitations: Ventilator, Patient immobility, patient positioning Performing Technologist: Oliver Hum RVT  Examination Guidelines: A complete evaluation includes B-mode imaging, spectral Doppler, color Doppler, and power Doppler as needed of all accessible portions of each vessel. Bilateral testing is considered an integral part of a complete examination. Limited examinations for reoccurring indications may be performed as noted.  Right Carotid Findings: +----------+--------+--------+--------+---------------------+------------------+           PSV cm/sEDV cm/sStenosisDescribe             Comments           +----------+--------+--------+--------+---------------------+------------------+ CCA Prox  141     0               smooth and  heterogenous                            +----------+--------+--------+--------+---------------------+------------------+ CCA Distal113     8               smooth and                                                                heterogenous                            +----------+--------+--------+--------+---------------------+------------------+ ICA Prox  82      7                                    High resistant                                                            flow               +----------+--------+--------+--------+---------------------+------------------+ ICA Distal75      9                                    High resistant                                                            flow               +----------+--------+--------+--------+---------------------+------------------+ ECA        71      0                                                       +----------+--------+--------+--------+---------------------+------------------+ +----------+--------+-------+--------+-------------------+           PSV cm/sEDV cmsDescribeArm Pressure (mmHG) +----------+--------+-------+--------+-------------------+ [id number]                                        +----------+--------+-------+--------+-------------------+ +---------+--------+--+--------+-+--------------+ VertebralPSV cm/s65EDV cm/s6High resistant +---------+--------+--+--------+-+--------------+  Left Carotid Findings: +----------+--------+--------+--------+---------------------+------------------+           PSV cm/sEDV cm/sStenosisDescribe             Comments           +----------+--------+--------+--------+---------------------+------------------+ CCA Prox  139     0  tortuous           +----------+--------+--------+--------+---------------------+------------------+ CCA Distal144     11              smooth and                                                                heterogenous                            +----------+--------+--------+--------+---------------------+------------------+ ICA Prox  93      7               smooth and           High resistant                                       heterogenous         flow               +----------+--------+--------+--------+---------------------+------------------+ ICA Distal124     8                                    High resistant                                                            flow               +----------+--------+--------+--------+---------------------+------------------+ ECA       91      0                                                       +----------+--------+--------+--------+---------------------+------------------+  +----------+--------+--------+--------+-------------------+ SubclavianPSV cm/sEDV cm/sDescribeArm Pressure (mmHG) +----------+--------+--------+--------+-------------------+           278                                         +----------+--------+--------+--------+-------------------+ +---------+--------+--+--------+-+--------------+ VertebralPSV cm/s96EDV cm/s7High resistant +---------+--------+--+--------+-+--------------+  Summary: Right Carotid: Velocities in the right ICA are consistent with a 1-39% stenosis.                High resistant waveforms are noted in the proximal and distal ICA                and the vertebral artery. Left Carotid: Velocities in the left ICA are consistent with a 1-39% stenosis.               High resistant waveforms are noted in the proximal and distal ICA               and the vertebral artery.  *See table(s)  above for measurements and observations.     Preliminary    Dg Chest Port 1 View 08/28/2018 Slightly improved lung volume. No change in bibasilar atelectasis/pneumonia.    2D Echocardiogram  1. The left ventricle has hyperdynamic systolic function, with an ejection fraction of >65%. The cavity size was normal. There is mildly increased left ventricular wall thickness. Left ventricular diastolic Doppler parameters are consistent with  impaired relaxation. Indeterminate filling pressures The E/e' is 8-15. No evidence of left ventricular regional wall motion abnormalities.  2. The right ventricle has normal systolic function. The cavity was normal. There is no increase in right ventricular wall thickness.  3. The mitral valve is grossly normal.  4. The tricuspid valve is grossly normal.  5. The aortic valve is tricuspid Mild sclerosis of the aortic valve.  6. The inferior vena cava was normal in size with <50% respiratory variability.   PHYSICAL EXAM Temp:  [98.5 F (36.9 C)-100 F (37.8 C)] 98.6 F (37 C) (03/30 1200) Pulse Rate:   [62-94] 74 (03/30 1000) Resp:  [16-26] 18 (03/30 1000) BP: (104-149)/(43-93) 145/51 (03/30 1000) SpO2:  [98 %-100 %] 100 % (03/30 1118) FiO2 (%):  [30 %-40 %] 30 % (03/30 1118)  General - Well nourished, well developed, intubated  .  Ophthalmologic - fundi not visualized due to noncooperation.  Cardiovascular - Regular rate and rhythm.  Neuro - intubated off sedation, Eyes open on voice, following minimal commands such as stick tongue out to command. With eye opening, eyes in left gaze position then moved to mid position, not blinking to visual threat, doll's eyes present, not tracking, PERRL. Corneal reflex present bilaterally, gag and cough present. Breathing over the vent.  Facial symmetry not able to test due to ET tube.  Tongue midline in mouth. On pain stimulation, brisk movement on the left side, 3+/5 LUE and 2/5 RUE, as well as 3/5 LLE and and 2/5 RLE. DTR diminished and no babinski. Sensation, coordination and gait not tested.   ASSESSMENT/PLAN Ms. Beth Pope is a 78 y.o. female with history of DB, HTN, COPD presenting with R sided weakness, facial droop and slurred speech. BP 230 en route. CT neg. Received tPA 08/19/2018 at 2238. HA, mental status change and fluctuating BP post tPA. CT showed SAH and ICH. Reversed w/ TXA.   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 tPA reversed with TXA, source unclear, hemorrhages suspicious for underlying CAA Resultant intubated on sedation not following commands Stable neuro status with increased # hemorrhages    Code Stroke CT head 3/23 2225 No acute stroke. ASPECTS 10.   CT head 3/24 0006 multifocal B IPH and SAH. No mass effect  CT head  3/24 0144 worsening ICH w/ new R temporal hmg 55m. slightly increased size of other small ICH with unchanged SAH. No shift.  MRI 3/24 1458 stable in size and number of mult IC hmgs.  MRA  Unremarkable   CT head 3/24 2113 unchanged size, distribution hmgs  Carotid Doppler B  ICA 1-39% stenosis, VAs antegrade   2D Echo  EF > 65%. No source of embolus   LDL 102  HgbA1c 10.4  Fibrinogen 336 s/p TXA  SCDs for VTE prophylaxis  No antithrombotic prior to admission, now on No antithrombotic given post tPA hemorrhage   Started on Keppra 500 q 12h for seizure prophylaxis - will continue for total 7 days - end date adjusted under orders  Therapy recommendations:  pending  Disposition:  pending   Code status - DNR - daughter previously stated that pt would not want PEG or Trach.  Will have palliative care involved for goals of care discussion  Hypertensive Emergency  BP 230 per EMS  Lowered w. labetalol for tPA administration   Required cleviprex post tPA for fluctuating BP control  Off Cleviprex now  SBP goal < 160 (adjusted all med orders)  On po BP meds with amlodipine 10, lisinopril 73m bid  Increase Coreg to 12.5 twice daily  Close monitoring  Acute Reparatory Failure  Unable to protect airway following ICH after tPA  Intubated in ED  On low dose propofol   CCM on board  Mental status precludes extubation   Family stated that pt would not want PEG or Trach  Fever Leukocytosis  Tmax 100.8-> afebrile  CXR small b/l pleural effusion and atelectasis b/l basilar   Repeat CXR 3/26 slightly improved lung volume. No change in bibasilar atelectasis/pneumonia.   WBC 11.7>13.7>16.3  CCM on board, appreciate CCM  Ice pack and tylenol PRN  Close monitoring  Dysaphgia  Secondary to stroke  NPO  On tube feeding @ 20  Resume some home meds via OG  SLP signed off d/t continued intubation, reorder when needed  Hyperlipidemia  Home meds:  No statin  LDL 102, goal < 70  Hold statin given hemorrhage  Consider statin at discharge based on plan of care  Diabetes type II  HgbA1c 10.4, goal < 7.0  Uncontrolled  Home DM Meds: Humalog 75/25 Insulin- 28 units AM/ 22 units PM  On lantus 12 bid  novolog 3u q 4h as per  DB RN recommends for TF coverage  CBG monitoring   Hyperglycemia improved  Mod SSI  CCM on board   Prognosis  Palliative Care met with family regarding poor prognosis on 3/28  Family needs more time to process situation  Pt currently DNR  Daughter previously stated that pt would not want PEG or Trach  Check labs in AM unless decision made to proceed with comfort measures   Other Stroke Risk Factors  Advanced age  Obesity, Body mass index is 36.2 kg/m., recommend weight loss, diet and exercise as appropriate   Other Active Problems  AKI, resolved Cr 1.2>0.70-> 0.59  COPD  Anemia, Hgb 13.9>11.2>10>9.9>9.5  Hypokalemia, resolved 3.8->3.5  Thrombocytopenia, stable PLT 136     Hospital day # 748    78year old female here with left brain infarct, TPA with resultant subarachnoid hemorrhage and intracranial hemorrhage, intubated, on sedation, following minimal commands, neuro status stable or slightly improved today and she will follow limited commands.  On Keppra for seizure prophylaxis.  She is DNR.  Palliative care involved due to likely poor outcome. Family wants to support for a few days before withdrawal of care Other etiologies complicating her condition include hypertensive emergency, hypoglycemia, respiratory failure, at risk for neurological worsening due to hematoma expansion, recurrent stroke, cerebral edema, seizure or brain herniation. This patient is critically ill and at significant risk of neurological worsening, death and care requires constant monitoring of vital signs, hemodynamics,respiratory and cardiac monitoring,review of multiple databases, neurological assessment, discussion with family, other specialists and medical decision making of high complexity.I  I spent 30 minutes of neurocritical care time in the care of this patient.  PAntony Contras MD [facility]   To contact Stroke Continuity provider, please refer to  [email or web address] After hours, contact General Neurology

## 2018-09-01 NOTE — Progress Notes (Addendum)
NAME:  Milissia Hutzel, MRN:  [id number], DOB:  02-28-1941, LOS: 7 ADMISSION DATE:  09/17/2018, CONSULTATION DATE:  08/26/18 REFERRING MD:  Aroor  CHIEF COMPLAINT:  AMS   Brief History   Yaricza Younes is a 78 y.o. female who was admitted 3/23 with concern for CVA (NIH 6).  She received tPA and later had ICH.  She required intubation and was then transferred to the neuro ICU. She received 1g of TXA and was then transferred to the neuro ICU and PCCM was asked to see in consultation.  Past Medical History  HTN, COPD, DM.  Significant Hospital Events   3/23 Admit. 3/24 Intubated after ICH following tPA. 3/26 Remains on vent, PSV wean 10/5 08/29/2018 not extubated at this time. 3/28 seen by palliative care. Plan to cont supportive care but working towards w/d  Consults:  PCCM.  Procedures:  ETT 3/24 >> L radial a line 3/24 >>   Significant Diagnostic Tests:  CT head 3/23 > negative. CT head 3/24 > multifocal IPH and SAH. CT head 3/24 > worsening ICH with new IPH.  Slightly increased size of multiple other small foci of hemorrhage. MRI/MRA brain 3/24 > increase in both size and number of multiple intracranial hemorrhages of an acute nature, MRA with no flow limiting stenosis or acute findings  Echo 3/24 > LVEF 65-70%, mild LVH, grade 1 DD  Micro Data:  Tracheal Aspirate 3/24 >>  MRSA PCR neg 3/24  Antimicrobials:     Interim history/subjective:  Remains on propofol due to strong cough  Objective:  Blood pressure (Abnormal) 142/45, pulse 86, temperature 99.2 F (37.3 C), temperature source Oral, resp. rate 17, height 5\' 3"  (1.6 m), weight 92.7 kg, SpO2 98 %.    Vent Mode: PSV;CPAP FiO2 (%):  [30 %-40 %] 30 % Set Rate:  [16 bmp] 16 bmp Vt Set:  [420 mL] 420 mL PEEP:  [5 cmH20] 5 cmH20 Pressure Support:  [10 cmH20-12 cmH20] 12 cmH20 Plateau Pressure:  [15 cmH20-21 cmH20] 15 cmH20   Intake/Output Summary (Last 24 hours) at 09/01/2018 0811 Last data filed at 09/01/2018  0700 Gross per 24 hour  Intake 2473.83 ml  Output 950 ml  Net 1523.83 ml   Filed Weights   08/29/18 0428 08/30/18 0336 08/31/18 0500  Weight: 90.3 kg 91.6 kg 92.7 kg    Examination:   General: 78 year old female patient currently on full ventilatory support HEENT normocephalic atraumatic copious oral secretions orally intubated pupils equal reactive Pulmonary: Occasional rhonchi no accessory use Cardiac: Regular rate and rhythm Abdomen: Soft nontender Extremities: Warm and dry strong pulses Neuro: Will not follow commands.  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  S/p 1g TXA Plan Continuing Keppra for seizure prophylaxis, completing 7 days of this. Continuing amlodipine 10 mg, lisinopril 20 twice daily and Coreg 12.5 twice daily  Acute Respiratory Insufficiency acute hypoxemic respiratory failure requiring intubation mechanical ventilation -due to inability to protect the airway in the setting of CVA / ICH. Not a candidate for extubation at this point given inability to control oral secretions.  Family discussing goals of care with palliative services, apparently would not want tracheostomy Plan Continue supportive care Continue full ventilator support VAP bundle Awaiting further family discussion, likely looking at one-way extubation  Fluid and electrolyte imbalance: hypokalemia  Plan Replace and recheck  Anemia  -hgb drifted ~ 1 gm last night; * bleeding  Plan Trend cbc Transfuse for hgb < 7   Thrombocytopenia  Plan Trend cbc  DM w/ hyperglycemia  Plan ssi   At Risk Malnutrition  Plan Cont tubefeeds  Seen with Zenia Resides Exam: Frail, orally intubated Rhonchi bilaterally S1-S2 appreciated Abdomen soft nontender Not following commands, does move extremities  Left-sided  CVA, hemorrhagic conversion On seizure prophylaxis Blood pressure control  Acute respiratory failure Continue ventilator support VAP bundle Awaiting further goals of care discussion, comfort measures is appropriate  Anemia Continue to monitor closely, transfuse hemoglobin less than 7  Electrolyte derangements being repleted  Appreciate palliative care services involvement in further discussing goals of care  Critical care time spent evaluating patient, reviewing records, formulating plan of care 32 minutes  Best Practice:  Diet: NPO / TF    Pain/Anxiety/Delirium protocol (if indicated): Currently not on sedation VAP protocol (if indicated): In place. DVT prophylaxis: SCD's. GI prophylaxis: PPI. Glucose control: SSI. Mobility: Bedrest. Code Status: Made DNR by neurology 08/27/2018 Family Communication: 08/29/2018 no family at bedside Disposition: Mains critically ill due to ineffective airway protection and need for mechanical ventilation awaiting further discussion with family appreciate palliative care assistance  Simonne Martinet ACNP-BC [facility] Pulmonary/Critical Care Pager # [phone] OR # [phone] if no answer

## 2018-09-01 NOTE — Progress Notes (Signed)
Daily Progress Note   Patient Name: Beth Pope       Date: 09/01/2018 DOB: 18-Oct-1940  Age: 78 y.o. MRN#: [id number] Attending Physician: Rosalin Hawking, MD Primary Care Physician: Endocrinology, [facility] Admit Date: 08/31/2018  Reason for Consultation/Follow-up: Establishing goals of care  Subjective: Spoke with RN, PT, and family as well as patient.  Patient is responding to commands.  When propofol is turned off she responds to "give me a thumbs up" failure quickly by raising up her right thumb.  She opens her eyes to voice.  Chewing on tube.  Moving extremities.  Family sees her briefly on E-Link and they were appreciative.  Unfortunately there were some technical difficulties on the family's end.  We will try to repeat the conference call tomorrow at noon.  The family will try to be on a computer rather than a smart phone.  After the video conference I talked with daughter Beth Pope.  I explained that the good news was that it appears her mother is more alert than she has been.  The bad news is that the imaging shows multiple strokes in both halves of her brain (12? 13?) and we do not know how much functionality she will regain.   I asked Beth Pope - what type of woman is your mother?  Would she want to be fed artificially long term?  Would she want to live out the rest of her life in a facility?   Does she want to be kept alive if she is unable to get out of bed on her own?    Beth Pope replied quickly that her mother would not want to be fed artificially or live out her life in a facility. Beth Pope expressed that she could most likely bring her mother home to be cared for.  Beth Pope made several comments to the effect that - God is in control and will decide what happens.  Simultaneously she seemed certain that her  mother would not want to live life in a very debilitated state.  In the next 24 - 48 hours we will likely know whether or not Beth Pope can survive off of the ventilator and can she eat safely on her own.  I expressed to Beth Pope that these will be very important data points.  We agreed to talk again tomorrow.  Assessment: 78 yo female with multiple bilateral strokes.  Seems to be becoming more responsive.   Patient Profile/HPI:  78 y.o. female  with past medical history of HTN, COPD, and DM admitted on 08/26/2018 with R sided weakness, facial droop, and slurred speech. CT initially negative. Received tPA in ED. Had mental status change post TPA - CT then revealed SAH and ICH. Received TXA.  She required intubation on 3/24.3/24 MRI revealed increase in both size and number of multiple ICH. Patient has been off of sedation - she is not following any commands - she will open eyes and grimace to pain. Unable to extubate patient d/t mental status. Per previous discussions with neurology, patient was made DNR and family stated she would never want trach/peg. PMT consulted for [place].  Length of Stay: 7  Current Medications: Scheduled Meds:  .  stroke: mapping our early stages of recovery book   Does not apply Once  . amLODipine  10 mg Per Tube Daily  . carvedilol  12.5 mg Per Tube BID  . chlorhexidine gluconate (MEDLINE KIT)  15 mL Mouth Rinse BID  . feeding supplement (PRO-STAT SUGAR FREE 64)  30 mL Per Tube 5 X Daily  . feeding supplement (VITAL HIGH PROTEIN)  1,000 mL Per Tube Q24H  . insulin aspart  0-15 Units Subcutaneous Q4H  . insulin aspart  3 Units Subcutaneous Q4H  . insulin glargine  12 Units Subcutaneous BID  . ipratropium-albuterol  3 mL Nebulization QID  . lisinopril  20 mg Per Tube BID  . mouth rinse  15 mL Mouth Rinse 10 times per day  . multivitamin with minerals  1 tablet Per Tube Daily  . pantoprazole sodium  40 mg Per Tube Daily    Continuous Infusions: . sodium chloride 50  mL/hr at 09/01/18 0700  . levETIRAcetam 500 mg (09/01/18 0953)  . propofol (DIPRIVAN) infusion 15 mcg/kg/min (09/01/18 0700)    PRN Meds: acetaminophen **OR** acetaminophen (TYLENOL) oral liquid 160 mg/5 mL **OR** acetaminophen, fentaNYL (SUBLIMAZE) injection, hydrALAZINE, senna-docusate  Physical Exam        Well developed elderly female, intubated, biting the tube, opening her eyes, moving  CV rrr resp no distress Abdomen soft nt nd Responds to commands - slowly on propofol, then more quickly when propofol is turned off.  Vital Signs: BP (!) 145/51   Pulse 74   Temp 98.6 F (37 C) (Axillary)   Resp 18   Ht _0  (1.6 m)   Wt 92.7 kg   SpO2 100%   BMI 36.20 kg/m  SpO2: SpO2: 100 % O2 Device: O2 Device: Ventilator O2 Flow Rate:    Intake/output summary:   Intake/Output Summary (Last 24 hours) at 09/01/2018 1327 Last data filed at 09/01/2018 0700 Gross per 24 hour  Intake 2473.83 ml  Output 950 ml  Net 1523.83 ml   LBM: Last BM Date: (PTA) Baseline Weight: Weight: 88.6 kg Most recent weight: Weight: 92.7 kg       Palliative Assessment/Data: 10%    Flowsheet Rows     Most Recent Value  Intake Tab  Referral Department  Critical care  Unit at Time of Referral  ICU  Palliative Care Primary Diagnosis  Neurology  Date Notified  08/30/18  Palliative Care Type  New Palliative care  Reason for referral  Clarify Goals of Care  Date of Admission  08/22/2018  Date first seen by Palliative Care  08/30/18  # of days Palliative referral response time  0 Day(s)  #  of days IP prior to Palliative referral  5  Clinical Assessment  Palliative Performance Scale Score  10%  Psychosocial & Spiritual Assessment  Palliative Care Outcomes  Patient/Family meeting held?  Yes  Who was at the meeting?  daughter  Palliative Care Outcomes  Provided end of life care assistance, Provided psychosocial or spiritual support      Patient Active Problem List   Diagnosis Date Noted  .  Respiratory failure ([place])   . Goals of care, counseling/discussion   . Palliative care by specialist   . Endotracheally intubated 08/26/2018  . Hypertension, essential 08/26/2018  . COPD without exacerbation ([place]) 08/26/2018  . Intracerebral hemorrhage ([place]) 08/26/2018  . Stroke (cerebrum) ([place]) 08/29/2018    Palliative Care Plan    Recommendations/Plan:  Needs another couple of days to determine [place].  Patient appears to be following commands now.  PMT scheduled another meeting with family on 3/31 at noon to update them and continue to refine goals.  Goals of Care and Additional Recommendations:  Limitations on Scope of Treatment: Full Scope Treatment  Code Status:  DNR  Prognosis:   Unable to determine.  Will be able to give a better prognosis as her hospitalization unfolds.  A this point she appears to be improving slightly.  Will need to determine if she can breathe off vent and eat safely.   Discharge Planning:  To Be Determined  Care plan was discussed with bedside RN, family, PT  Thank you for allowing the Palliative Medicine Team to assist in the care of this patient.  Total time spent:  45 min.     Greater than 50%  of this time was spent counseling and coordinating care related to the above assessment and plan.  Florentina Jenny, PA-C Palliative Medicine  Please contact Palliative MedicineTeam phone at [phone] for questions and concerns between 7 am - 7 pm.   Please see AMION for individual provider pager numbers.

## 2018-09-01 NOTE — Progress Notes (Signed)
Initial Nutrition Assessment   DOCUMENTATION CODES:   Obesity unspecified  INTERVENTION:   Adjust TF Vital High Protein @ 45 ml/hr via OG tube 30 ml Prostat daily  MVI daily  Provides: 1180 kcal, 109 grams protein, and 902 ml free water.  TF regimen with propofol at current rate providing 1320 total kcal/day.   NUTRITION DIAGNOSIS:   Inadequate oral intake related to inability to eat as evidenced by NPO status. Ongoing  GOAL:   Provide needs based on ASPEN/SCCM guidelines Progressing  MONITOR:   TF tolerance, I & O's  REASON FOR ASSESSMENT:   Consult Enteral/tube feeding initiation and management  ASSESSMENT:   Pt with PMH of HTN, COPD, and DM who was admitted 3/24 for a stroke she received tPA and then developed an ICH.    Pt discussed during ICU rounds and with RN.   Pt intubated following stroke and developing ICH after tPA.  Palliative care is involved.   Patient is currently intubated on ventilator support MV: 5.2 L/min Temp (24hrs), Avg:99 F (37.2 C), Min:98.5 F (36.9 C), Max:100 F (37.8 C) MAP: 65 Pt is positive 8 L  Propofol @ 5.32 ml/hr (10 mcg) Cleviprex now off  Medications reviewed and include: 12 units lantus BID, SSI, 3 units novolog every 4 hours, MVI Labs reviewed: K+ 3.3 (L)   Lab Results  Component Value Date   HGBA1C 10.4 (H) 08/26/2018      NUTRITION - FOCUSED PHYSICAL EXAM:  Deferred   Diet Order:   Diet Order            Diet NPO time specified  Diet effective now              EDUCATION NEEDS:   No education needs have been identified at this time  Skin:  Skin Assessment: Skin Integrity Issues: Skin Integrity Issues:: Stage II Stage II: sacrum   Last BM:  unknown  Height:   Ht Readings from Last 1 Encounters:  September 06, 2018 5\' 3"  (1.6 m)    Weight:   Wt Readings from Last 1 Encounters:  08/31/18 92.7 kg    Ideal Body Weight:  52.2 kg  BMI:  Body mass index is 36.2 kg/m.  Estimated Nutritional  Needs:   Kcal:  1000-1300  Protein:  >104 grams  Fluid:  > 1.5L/day  Kendell Bane RD, LDN, CNSC [phone] Pager [phone] After Hours Pager

## 2018-09-01 NOTE — Progress Notes (Addendum)
Physical Therapy Treatment Patient Details Name: Beth Pope MRN: [id number] DOB: Oct 06, 1940 Today's Date: 09/01/2018    History of Present Illness Beth Pope is an 78 y.o. female past medical history of diabetes mellitus, COPD, hypertension presents to the emergency room as a code stroke for sudden onset right-sided weakness, facial droop and slurred speech.  TPA given, but later pt showed to be having neurological symptoms.  CT then showed multiple focal SAH without midline shift.  Pt was intubated.  She remains intubated via ETT.  Palliative consulted.    PT Comments    Functionally similar to the evaluation, needing +2 Total assist, BUT pt was able to show ability to follow 1-step command intermittently with a 5-20 sec delay.  Emphasis on transition to EOB, sitting tolerance and truncal activation, and following commands at EOB.      Follow Up Recommendations  SNF     Equipment Recommendations  None recommended by PT;Other (comment)(TBA)    Recommendations for Other Services       Precautions / Restrictions Precautions Precaution Comments: intubated by ETT    Mobility  Bed Mobility Overal bed mobility: Needs Assistance Bed Mobility: Supine to Sit;Sit to Supine     Supine to sit: Total assist;+2 for physical assistance Sit to supine: Total assist;+2 for physical assistance   General bed mobility comments: pt is moving, but not purposefully to assist  Transfers                 General transfer comment: not attempted due to pt is restless and will be imminently video conferencing with her family  Ambulation/Gait                 Stairs             Wheelchair Mobility    Modified Rankin (Stroke Patients Only) Modified Rankin (Stroke Patients Only) Pre-Morbid Rankin Score: No symptoms Modified Rankin: Severe disability     Balance Overall balance assessment: Needs assistance Sitting-balance support: Single extremity  supported;Bilateral upper extremity supported Sitting balance-Leahy Scale: Zero Sitting balance - Comments: pt with minimal truncal control.  Given support she can grossly extend or flex trunK.  With significant stability and cues she moves bil UE's and L LE intermittently to cuing with a 5 to 20 sec delay.                                    Cognition Arousal/Alertness: Awake/alert;Lethargic Behavior During Therapy: Restless Overall Cognitive Status: Impaired/Different from baseline Area of Impairment: Following commands;Awareness;Problem solving                       Following Commands: Follows one step commands with increased time;Follows one step commands inconsistently(5-20 secs)   Awareness: Intellectual Problem Solving: Slow processing General Comments: Pt needs cues for attention then time to follow commands though inconsistent      Exercises Other Exercises Other Exercises: completed basic PROM/AAROM to all 4 extremities as warm up.    General Comments        Pertinent Vitals/Pain Faces Pain Scale: No hurt    Home Living                      Prior Function            PT Goals (current goals can now be found in the care plan section) Acute Rehab  PT Goals PT Goal Formulation: Patient unable to participate in goal setting Time For Goal Achievement: 09/13/18 Potential to Achieve Goals: Fair Progress towards PT goals: Progressing toward goals    Frequency    Min 3X/week      PT Plan Current plan remains appropriate    Co-evaluation PT/OT/SLP Co-Evaluation/Treatment: Yes Reason for Co-Treatment: Complexity of the patient's impairments (multi-system involvement) PT goals addressed during session: Mobility/safety with mobility OT goals addressed during session: Strengthening/ROM      AM-PAC PT "6 Clicks" Mobility   Outcome Measure  Help needed turning from your back to your side while in a flat bed without using  bedrails?: Total Help needed moving from lying on your back to sitting on the side of a flat bed without using bedrails?: Total Help needed moving to and from a bed to a chair (including a wheelchair)?: Total Help needed standing up from a chair using your arms (e.g., wheelchair or bedside chair)?: Total Help needed to walk in hospital room?: Total Help needed climbing 3-5 steps with a railing? : Total 6 Click Score: 6    End of Session Equipment Utilized During Treatment: Oxygen Activity Tolerance: Patient tolerated treatment well Patient left: in bed;with call bell/phone within reach;with bed alarm set;with SCD's reapplied Nurse Communication: Mobility status PT Visit Diagnosis: Other symptoms and signs involving the nervous system (R29.898);Muscle weakness (generalized) (M62.81)     Time: 2330-0762 PT Time Calculation (min) (ACUTE ONLY): 25 min  Charges:  $Therapeutic Activity: 8-22 mins                     09/01/2018  [place] Bing, PT Acute Rehabilitation Services [phone]  (pager) [phone]  (office)   Eliseo Gum Barak Bialecki 09/01/2018, 12:18 PM

## 2018-09-01 NOTE — Progress Notes (Signed)
Initial external length of OG charted as 66cm, external OG length measured at 77cm. CCM MD notified, x-ray ordered for placement verification. No acute change in patient or patient respiratory status, will continue to monitor.  Aris Lot, RN

## 2018-09-02 ENCOUNTER — Inpatient Hospital Stay (HOSPITAL_COMMUNITY): Payer: Medicare Other

## 2018-09-02 DIAGNOSIS — R0689 Other abnormalities of breathing: Secondary | ICD-10-CM

## 2018-09-02 DIAGNOSIS — I612 Nontraumatic intracerebral hemorrhage in hemisphere, unspecified: Secondary | ICD-10-CM

## 2018-09-02 DIAGNOSIS — I6300 Cerebral infarction due to thrombosis of unspecified precerebral artery: Secondary | ICD-10-CM

## 2018-09-02 LAB — GLUCOSE, CAPILLARY
GLUCOSE-CAPILLARY: 124 mg/dL — AB (ref 70–99)
Glucose-Capillary: 152 mg/dL — ABNORMAL HIGH (ref 70–99)
Glucose-Capillary: 44 mg/dL — CL (ref 70–99)
Glucose-Capillary: 150 mg/dL — ABNORMAL HIGH (ref 70–99)
Glucose-Capillary: 170 mg/dL — ABNORMAL HIGH (ref 70–99)
Glucose-Capillary: 84 mg/dL (ref 70–99)
Glucose-Capillary: 151 mg/dL — ABNORMAL HIGH (ref 70–99)

## 2018-09-02 LAB — BASIC METABOLIC PANEL
Anion gap: 5 (ref 5–15)
BUN: 27 mg/dL — ABNORMAL HIGH (ref 8–23)
CO2: 27 mmol/L (ref 22–32)
Calcium: 9.1 mg/dL (ref 8.9–10.3)
Chloride: 114 mmol/L — ABNORMAL HIGH (ref 98–111)
Creatinine, Ser: 0.72 mg/dL (ref 0.44–1.00)
GFR calc Af Amer: >60 mL/min (ref >60)
GLUCOSE: 125 mg/dL — AB (ref 70–99)
Potassium: 3.4 mmol/L — ABNORMAL LOW (ref 3.5–5.1)
Sodium: 146 mmol/L — ABNORMAL HIGH (ref 135–145)

## 2018-09-02 MED ORDER — INSULIN ASPART 100 UNIT/ML ~~LOC~~ SOLN
2.0000 [IU] | SUBCUTANEOUS | Status: DC
  Administered 2018-09-02 – 2018-09-03 (×2): 2 [IU] via SUBCUTANEOUS

## 2018-09-02 MED ORDER — QUETIAPINE FUMARATE 25 MG PO TABS
50.0000 mg | ORAL_TABLET | Freq: Two times a day (BID) | ORAL | Status: DC
  Administered 2018-09-02 (×2): 50 mg
  Filled 2018-09-02 (×2): qty 2

## 2018-09-02 MED ORDER — INSULIN ASPART 100 UNIT/ML ~~LOC~~ SOLN
0.0000 [IU] | SUBCUTANEOUS | Status: DC
  Administered 2018-09-02: 1 [IU] via SUBCUTANEOUS
  Administered 2018-09-03: 2 [IU] via SUBCUTANEOUS

## 2018-09-02 MED ORDER — DEXTROSE 50 % IV SOLN
INTRAVENOUS | Status: AC
  Filled 2018-09-02: qty 50

## 2018-09-02 MED ORDER — POTASSIUM CHLORIDE 20 MEQ/15ML (10%) PO SOLN
40.0000 meq | Freq: Once | ORAL | Status: AC
  Administered 2018-09-02: 40 meq
  Filled 2018-09-02: qty 30

## 2018-09-02 MED ORDER — DEXTROSE 50 % IV SOLN
50.0000 mL | Freq: Once | INTRAVENOUS | Status: AC
  Administered 2018-09-02: 50 mL via INTRAVENOUS

## 2018-09-02 MED ORDER — FREE WATER
200.0000 mL | Status: DC
  Administered 2018-09-02 – 2018-09-03 (×6): 200 mL

## 2018-09-02 NOTE — Progress Notes (Signed)
Inpatient Diabetes Program Recommendations  AACE/ADA: New Consensus Statement on Inpatient Glycemic Control (2015)  Target Ranges:  Prepandial:   less than 140 mg/dL      Peak postprandial:   less than 180 mg/dL (1-2 hours)      Critically ill patients:  140 - 180 mg/dL   Lab Results  Component Value Date   GLUCAP 170 (H) 09/02/2018   HGBA1C 10.4 (H) 08/26/2018    Review of Glycemic Control Results for Beth Pope, BRINGMAN (MRN [id number]) as of 09/02/2018 11:39  Ref. Range 09/01/2018 23:19 09/02/2018 03:19 09/02/2018 07:54 09/02/2018 08:45 09/02/2018 11:24  Glucose-Capillary Latest Ref Range: 70 - 99 mg/dL 022 (H) 336 (H) Novolog 3 units corr + 3 TF coverage 44 (LL) 150 (H) 170 (H)    Inpatient Diabetes Program Recommendations:   Hypoglycemia @ 0754 post tube feed coverage + moderate correction. Consider: -Decrease Novolog correction scale to sensitive -Decrease tube feed coverage to 2 units tid  Thank you, [name]. [name], RN, MSN, CDE  Diabetes Coordinator Inpatient Glycemic Control Team Team Pager [phone] (8am-5pm) 09/02/2018 11:42 AM

## 2018-09-02 NOTE — Progress Notes (Signed)
Sputum collected and sent to the lab 

## 2018-09-02 NOTE — Progress Notes (Signed)
Physical Therapy Treatment Patient Details Name: Beth Pope MRN: [id number] DOB: 11/16/1940 Today's Date: 09/02/2018    History of Present Illness Beth Pope is an 78 y.o. female past medical history of diabetes mellitus, COPD, hypertension presents to the emergency room as a code stroke for sudden onset right-sided weakness, facial droop and slurred speech.  TPA given, but later pt showed to be having neurological symptoms.  CT then showed multiple focal SAH without midline shift.  Pt was intubated.  She remains intubated via ETT.  Palliative consulted.    PT Comments    Not as participative today, likely to do with a little more propofol on board.  Emphasis on transitions to sitting , balance/upright sitting, with ROM in general and shoulder retraction bil.   Follow Up Recommendations  SNF     Equipment Recommendations  None recommended by PT;Other (comment)    Recommendations for Other Services       Precautions / Restrictions Precautions Precaution Comments: intubated by ETT Restrictions Weight Bearing Restrictions: No    Mobility  Bed Mobility Overal bed mobility: Needs Assistance Bed Mobility: Supine to Sit;Sit to Supine     Supine to sit: Total assist;+2 for physical assistance Sit to supine: Total assist;+2 for physical assistance   General bed mobility comments: pt is moving, but not purposefully to assist  Transfers Overall transfer level: Needs assistance               General transfer comment: not attempted due to pt is restless and will be imminently video conferencing with her family  Ambulation/Gait                 Stairs             Wheelchair Mobility    Modified Rankin (Stroke Patients Only) Modified Rankin (Stroke Patients Only) Pre-Morbid Rankin Score: No symptoms Modified Rankin: Severe disability     Balance Overall balance assessment: Needs assistance Sitting-balance support: Single extremity  supported;Bilateral upper extremity supported Sitting balance-Leahy Scale: Zero Sitting balance - Comments: pt with minimal trunkal control.  Given support she can grossly extend or flex trunK.  With significant stability and cues she moves bil UE's and L LE intermittently to cuing with a 5 to 20 sec delay.                                    Cognition Arousal/Alertness: Awake/alert;Lethargic Behavior During Therapy: Restless;Flat affect(Keep eye closed) Overall Cognitive Status: Impaired/Different from baseline Area of Impairment: Following commands;Awareness;Problem solving                       Following Commands: Follows one step commands with increased time;Follows one step commands inconsistently(5-20 secs)   Awareness: Intellectual Problem Solving: Slow processing General Comments: Pt keeping her eye closed for majority of session. Coughing and spitting up feeding liquid.       Exercises Other Exercises Other Exercises: BUE ROM while sitting at EOB Other Exercises: Scapular retraction with Max A Other Exercises: bil LE ROM, in supine    General Comments General comments (skin integrity, edema, etc.): ETT, VSS      Pertinent Vitals/Pain Pain Assessment: Faces Faces Pain Scale: Hurts little more Pain Location: Coughing Pain Descriptors / Indicators: Discomfort;Grimacing Pain Intervention(s): Monitored during session    Home Living  Prior Function            PT Goals (current goals can now be found in the care plan section) Acute Rehab PT Goals Patient Stated Goal: unable--ETT PT Goal Formulation: Patient unable to participate in goal setting Time For Goal Achievement: 09/13/18 Potential to Achieve Goals: Fair Progress towards PT goals: Progressing toward goals(minimal improvement)    Frequency    Min 3X/week      PT Plan Current plan remains appropriate    Co-evaluation PT/OT/SLP  Co-Evaluation/Treatment: Yes Reason for Co-Treatment: Complexity of the patient's impairments (multi-system involvement) PT goals addressed during session: Mobility/safety with mobility OT goals addressed during session: Strengthening/ROM      AM-PAC PT "6 Clicks" Mobility   Outcome Measure  Help needed turning from your back to your side while in a flat bed without using bedrails?: Total Help needed moving from lying on your back to sitting on the side of a flat bed without using bedrails?: Total Help needed moving to and from a bed to a chair (including a wheelchair)?: Total Help needed standing up from a chair using your arms (e.g., wheelchair or bedside chair)?: Total Help needed to walk in hospital room?: Total Help needed climbing 3-5 steps with a railing? : Total 6 Click Score: 6    End of Session Equipment Utilized During Treatment: Oxygen Activity Tolerance: Patient tolerated treatment well Patient left: in bed;with call bell/phone within reach;with bed alarm set;with SCD's reapplied Nurse Communication: Mobility status PT Visit Diagnosis: Other symptoms and signs involving the nervous system (R29.898);Muscle weakness (generalized) (M62.81) Hemiplegia - caused by: Cerebral infarction;Nontraumatic SAH     Time: 0277-4128 PT Time Calculation (min) (ACUTE ONLY): 26 min  Charges:  $Therapeutic Activity: 8-22 mins                     09/02/2018  Paradise Bing, PT Acute Rehabilitation Services [phone]  (pager) [phone]  (office)   Eliseo Gum Estanislado Surgeon 09/02/2018, 6:18 PM

## 2018-09-02 NOTE — Progress Notes (Signed)
STROKE TEAM PROGRESS NOTE   INTERVAL HISTORY The patient remains intubated though she is off sedation and falling occasional commands as per nurse. She is moving left side purposefully and is able to move the right side partially. Palliative care team plan to discuss with patient's family goals of care.  Vitals:   09/02/18 1100 09/02/18 1138 09/02/18 1140 09/02/18 1200  BP: (!) 112/39   (!) 158/52  Pulse: 68   89  Resp: 16   20  Temp:    99 F (37.2 C)  TempSrc:    Axillary  SpO2: 100% 100% 100% 100%  Weight:      Height:       CBG (last 3)  Recent Labs    09/02/18 0754 09/02/18 0845 09/02/18 1124  GLUCAP 44* 150* 170*    CBC:  Recent Labs  Lab 08/29/18 0336 09/01/18 0120  WBC 16.3* 13.0*  HGB 9.5* 8.6*  HCT 30.7* 28.2*  MCV 70.7* 73.1*  PLT 136* 171    Basic Metabolic Panel:  Recent Labs  Lab 08/28/18 0411  08/30/18 0447 09/01/18 0120 09/02/18 0413  NA 137   < > 142 145 146*  K 4.0   < > 3.5 3.3* 3.4*  CL 107   < > 108 110 114*  CO2 22   < > 24 29 27   GLUCOSE 221*   < > 97 153* 125*  BUN 21   < > 21 26* 27*  CREATININE 0.70   < > 0.59 0.69 0.72  CALCIUM 8.9   < > 9.1 9.0 9.1  MG 1.9  --  1.9  --   --   PHOS 3.4  --  2.9  --   --    < > = values in this interval not displayed.    IMAGING past 24h Dg Chest Port 1 View  Result Date: 09/02/2018 CLINICAL DATA:  Intubated EXAM: PORTABLE CHEST 1 VIEW COMPARISON:  08/24/2018 FINDINGS: Endotracheal tube is 2 cm above the carina. NG tube is in the stomach. Heart is borderline in size. Low lung volumes with bibasilar atelectasis. No effusions or acute bony abnormality. IMPRESSION: Continued low lung volumes with bibasilar atelectasis. No real change. Electronically Signed   By: Charlett NoseKevin  Dover M.D.   On: 09/02/2018 07:58     2D Echocardiogram  1. The left ventricle has hyperdynamic systolic function, with an ejection fraction of >65%. The cavity size was normal. There is mildly increased left ventricular wall  thickness. Left ventricular diastolic Doppler parameters are consistent with  impaired relaxation. Indeterminate filling pressures The E/e' is 8-15. No evidence of left ventricular regional wall motion abnormalities.  2. The right ventricle has normal systolic function. The cavity was normal. There is no increase in right ventricular wall thickness.  3. The mitral valve is grossly normal.  4. The tricuspid valve is grossly normal.  5. The aortic valve is tricuspid Mild sclerosis of the aortic valve.  6. The inferior vena cava was normal in size with <50% respiratory variability.   PHYSICAL EXAM  General - Well nourished, well developed, intubated  .  Ophthalmologic - fundi not visualized due to noncooperation.  Cardiovascular - Regular rate and rhythm.  Neuro - intubated off sedation, Eyes open on voice, following minimal commands such as stick tongue out to command. With eye opening, eyes in left gaze position then moved to mid position, not blinking to visual threat, doll's eyes present, not tracking, PERRL. Corneal reflex present bilaterally, gag and cough  present. Breathing over the vent.  Facial symmetry not able to test due to ET tube.  Tongue midline in mouth. On pain stimulation, brisk movement on the left side, 3+/5 LUE and 2/5 RUE, as well as 3/5 LLE and and 2/5 RLE. DTR diminished and no babinski. Sensation, coordination and gait not tested.   ASSESSMENT/PLAN Ms. Manasvi Berndt is a 78 y.o. female with history of DB, HTN, COPD presenting with R sided weakness, facial droop and slurred speech. BP 230 en route. CT neg. Received tPA 2018/09/22 at 2238. HA, mental status change and fluctuating BP post tPA. CT showed SAH and ICH. Reversed w/ TXA.   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 tPA reversed with TXA, source unclear, hemorrhages suspicious for underlying CAA Resultant intubated on sedation not following commands Stable neuro status with increased #  hemorrhages    Code Stroke CT head 3/23 2225 No acute stroke. ASPECTS 10.   CT head 3/24 0006 multifocal B IPH and SAH. No mass effect  CT head  3/24 0144 worsening ICH w/ new R temporal hmg 109mL. slightly increased size of other small ICH with unchanged SAH. No shift.  MRI 3/24 1458 stable in size and number of mult IC hmgs.  MRA  Unremarkable   CT head 3/24 2113 unchanged size, distribution hmgs  Carotid Doppler B ICA 1-39% stenosis, VAs antegrade   2D Echo  EF > 65%. No source of embolus   LDL 102  HgbA1c 10.4  Fibrinogen 336 s/p TXA  SCDs for VTE prophylaxis  No antithrombotic prior to admission, now on No antithrombotic given post tPA hemorrhage   Completed course Keppra 500 q 12h for seizure prophylaxis for total 7 days   Therapy recommendations:  pending   Disposition:  pending   Code status - DNR - daughter previously stated that pt would not want PEG or Trach.  palliative care involved. Likely withdraw care in 24-48h. Palliative to follow up today.  Hypertensive Emergency  BP 230 per EMS  Lowered w. labetalol for tPA administration   Required cleviprex post tPA for fluctuating BP control  Off Cleviprex now  BP 150-160s  SBP goal < 160   On po BP meds with amlodipine 10, lisinopril 20mg  bid, Coreg to 12.5 twice daily  Close monitoring  Acute Reparatory Failure  Unable to protect airway following ICH after tPA  Intubated in ED  On low dose propofol   CCM on board  Mental status precludes extubation   Family stated that pt would not want PEG or Trach  Fever Leukocytosis  Tmax 100  CXR small b/l pleural effusion and atelectasis b/l basilar   Repeat CXR 3/26 slightly improved lung volume. No change in bibasilar atelectasis/pneumonia.   WBC 13  CCM on board, appreciate CCM  Ice pack and tylenol PRN  Close monitoring  Dysaphgia  Secondary to stroke  NPO  On tube feeding @ 20  SLP signed off d/t continued intubation, reorder  when needed  Hyperlipidemia  Home meds:  No statin  LDL 102, goal < 70  Hold statin given hemorrhage  Consider statin at discharge based on plan of care  Diabetes type II  HgbA1c 10.4, goal < 7.0  Uncontrolled  Home DM Meds: Humalog 75/25 Insulin- 28 units AM/ 22 units PM  On lantus 12 bid  Hypoglycemia during the night  DB RN following - recommends decreased novolog correction to sensitive and decrease TF coverage to 2u tid - DONE  CBG monitoring   Other Stroke Risk Factors  Advanced age  Obesity, Body mass index is 36.24 kg/m., recommend weight loss, diet and exercise as appropriate   Other Active Problems  AKI, resolved Cr 0.72  COPD  Anemia, Hgb 13.9>11.2>10>9.9>9.5->8.6  Hypokalemia, resolved 3.8->3.4  Thrombocytopenia, stable PLT 171  Hospital day # 8   I have personally obtained history,examined this patient, reviewed notes, independently viewed imaging studies, participated in medical decision making and plan of care.ROS completed by me personally and pertinent positives fully documented  I have made any additions or clarifications directly to the above note. 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 Patient is unlikely to live independently by myself and required 24-hour care. Discussed with palliative care physician Asst. Palliative care team plan to have a video conference with the patient's daughter to discuss goals of care. Family leaning towards one with extubation and DO NOT RESUSCITATE but will also have to make decisions about feeding tube and enteric tube later. This patient is critically ill and at significant risk of neurological worsening, death and care requires constant monitoring of vital signs, hemodynamics,respiratory and cardiac monitoring, extensive review of multiple databases, frequent  neurological assessment, discussion with family, other specialists and medical decision making of high complexity.I have made any additions or clarifications directly to the above note.This critical care time does not reflect procedure time, or teaching time or supervisory time of PA/NP/Med Resident etc but could involve care discussion time.  I spent 30 minutes of neurocritical care time  in the care of  this patient.     Delia Heady, MD Medical Director [facility] Pager: [phone] 09/02/2018 2:09 PM    To contact Stroke Continuity provider, please refer to [email or web address]. After hours, contact General Neurology

## 2018-09-02 NOTE — Progress Notes (Signed)
Occupational Therapy Treatment Patient Details Name: Beth Pope MRN: [id number] DOB: 21-Jul-1940 Today's Date: 09/02/2018    History of present illness Beth Pope is an 78 y.o. female past medical history of diabetes mellitus, COPD, hypertension presents to the emergency room as a code stroke for sudden onset right-sided weakness, facial droop and slurred speech.  TPA given, but later pt showed to be having neurological symptoms.  CT then showed multiple focal SAH without midline shift.  Pt was intubated.  She remains intubated via ETT.  Palliative consulted.   OT comments  Upon arrival, pt supine in bed with eye closed. Pt continuing to require Total A for bed mobility. Sitting at EOB for ~7-10 minutes with Max A for support. Facilitating PROM of BUEs and retraction of scapula while at EOB. Pt coughing and spitting up tube feed; RN notified. Pt keeping eye closed for majority of session. Continue to recommend SNF and will continue to follow acutely as admitted.    Follow Up Recommendations  SNF;Supervision/Assistance - 24 hour    Equipment Recommendations  Other (comment)(TBD next venue)    Recommendations for Other Services      Precautions / Restrictions Precautions Precaution Comments: intubated by ETT Restrictions Weight Bearing Restrictions: No       Mobility Bed Mobility Overal bed mobility: Needs Assistance Bed Mobility: Supine to Sit;Sit to Supine     Supine to sit: Total assist;+2 for physical assistance Sit to supine: Total assist;+2 for physical assistance   General bed mobility comments: pt is moving, but not purposefully to assist  Transfers Overall transfer level: Needs assistance               General transfer comment: not attempted due to pt is restless and will be imminently video conferencing with her family    Balance Overall balance assessment: Needs assistance Sitting-balance support: Single extremity supported;Bilateral upper  extremity supported Sitting balance-Leahy Scale: Zero Sitting balance - Comments: pt with minimal trunkal control.  Given support she can grossly extend or flex trunK.  With significant stability and cues she moves bil UE's and L LE intermittently to cuing with a 5 to 20 sec delay.                                   ADL either performed or assessed with clinical judgement   ADL Overall ADL's : Needs assistance/impaired                                       General ADL Comments: Total A for ADLs     Vision       Perception     Praxis      Cognition Arousal/Alertness: Awake/alert;Lethargic Behavior During Therapy: Restless;Flat affect(Keep eye closed) Overall Cognitive Status: Impaired/Different from baseline Area of Impairment: Following commands;Awareness;Problem solving                       Following Commands: Follows one step commands with increased time;Follows one step commands inconsistently(5-20 secs)   Awareness: Intellectual Problem Solving: Slow processing General Comments: Pt keeping her eye closed for majority of session. Coughing and spitting up feeding liquid.         Exercises Exercises: Other exercises Other Exercises Other Exercises: BUE ROM while sitting at EOB Other Exercises: Scapular retraction with Max A  Shoulder Instructions       General Comments On vent. VSS    Pertinent Vitals/ Pain       Pain Assessment: Faces Faces Pain Scale: Hurts little more Pain Location: Coughing Pain Descriptors / Indicators: Discomfort;Grimacing Pain Intervention(s): Monitored during session  Home Living                                          Prior Functioning/Environment              Frequency  Min 2X/week        Progress Toward Goals  OT Goals(current goals can now be found in the care plan section)  Progress towards OT goals: Progressing toward goals  Acute Rehab OT  Goals Patient Stated Goal: unable--ETT OT Goal Formulation: Patient unable to participate in goal setting Time For Goal Achievement: 09/15/18 Potential to Achieve Goals: Good ADL Goals Pt Will Perform Grooming: with mod assist;bed level(1 task) Additional ADL Goal #1: Pt will maintain eye opening 50% of session Additional ADL Goal #2: Pt will follow all one step commands with <5 second delay Additional ADL Goal #3: Pt will be able to maintain sitting EOB with min A as precursor to basic ADLs  Plan Discharge plan remains appropriate    Co-evaluation    PT/OT/SLP Co-Evaluation/Treatment: Yes Reason for Co-Treatment: Complexity of the patient's impairments (multi-system involvement);For patient/therapist safety;To address functional/ADL transfers   OT goals addressed during session: Strengthening/ROM      AM-PAC OT "6 Clicks" Daily Activity     Outcome Measure   Help from another person eating meals?: Total Help from another person taking care of personal grooming?: Total Help from another person toileting, which includes using toliet, bedpan, or urinal?: Total Help from another person bathing (including washing, rinsing, drying)?: Total Help from another person to put on and taking off regular upper body clothing?: Total Help from another person to put on and taking off regular lower body clothing?: Total 6 Click Score: 6    End of Session Equipment Utilized During Treatment: Other (comment)(Vent)  OT Visit Diagnosis: Other abnormalities of gait and mobility (R26.89);Muscle weakness (generalized) (M62.81);Other symptoms and signs involving cognitive function;Cognitive communication deficit (R41.841);Hemiplegia and hemiparesis Symptoms and signs involving cognitive functions: Cerebral infarction Hemiplegia - Right/Left: (both) Hemiplegia - caused by: Cerebral infarction   Activity Tolerance Patient limited by lethargy   Patient Left in bed;with call bell/phone within  reach;with bed alarm set   Nurse Communication Mobility status;Other (comment)(Pt spitting up tube feed)        Time: 3893-7342 OT Time Calculation (min): 28 min  Charges: OT General Charges $OT Visit: 1 Visit OT Treatments $Therapeutic Activity: 8-22 mins  Carlen Fils MSOT, OTR/L Acute Rehab Pager: [phone] Office: [phone]   Theodoro Grist Merleen Picazo 09/02/2018, 5:31 PM

## 2018-09-02 NOTE — Progress Notes (Signed)
Daily Progress Note   Patient Name: Beth Pope       Date: 09/02/2018 DOB: [birth year]  Age: 78 y.o. MRN#: [id number] Attending Physician: Garvin Fila, MD Primary Care Physician: Endocrinology, [facility] Admit Date: 08/17/2018  Reason for Consultation/Follow-up: Establishing goals of care and Psychosocial/spiritual support  Subjective: RN Lauree Chandler and Manuela Schwartz of [facility] allowed the family to view [name].  [name] did move around and actually became somewhat agitated when hearing the family's voices.  Per Lauree Chandler [name] did not wean well this morning but the respiratory therapist had tried again and she had been weaning well for the past 1/2 hour .  She has been having thick secretions and coughing.  I spoke with Jenkins Rouge (daughter) after the video link.  I explained to [name] that CCM feels her mother may be able to be extubated as early as tomorrow.   I further explained that if [name] does well we will support her (and that we pray this is what will happen), however, if she does not do well we will make her comfortable, we will not put the tube back in, and she will die.   At this point [name] became frustrated and asked in anger "If I can be with her when she is dying why can't I come see her now?"   I empathized with her over the difficult times we are in right now.    We discussed what happens if her mother does well.  We will check her swallow.  If she can breathe the next big hurdle will be whether or not she can eat.  She has had 12+/- hemorrhages in her brain.  She may function much like a patient with advanced dementia.  [name] could relate to this as she has worked as a Quarry manager in a SNF taking care of dementia patients.  [name] confirmed again that the family's intent  is to bring [name] home and care for her.  [name] states that 24 hour care will be provided.     I committed to update [name] and the family again tomorrow.     Assessment: Patient with moderately thick secretions, coughing.  Somewhat agitated moving arms and legs, chewing on the ETT.   Patient Profile/HPI:  78 y.o. female  with past medical history of HTN,  COPD, and DM admitted on 09/02/2018 with R sided weakness, facial droop, and slurred speech. CT initially negative. Received tPA in ED. Had mental status change post TPA - CT then revealed SAH and ICH. Received TXA.  She required intubation on 3/24.3/24 MRI revealed increase in both size and number of multiple ICH. Patient has been off of sedation - she is not following any commands - she will open eyes and grimace to pain. Unable to extubate patient d/t mental status. Per previous discussions with neurology, patient was made DNR and family stated she would never want trach/peg. PMT consulted for [place].   Length of Stay: 8  Current Medications: Scheduled Meds:  .  stroke: mapping our early stages of recovery book   Does not apply Once  . amLODipine  10 mg Per Tube Daily  . carvedilol  12.5 mg Per Tube BID  . chlorhexidine gluconate (MEDLINE KIT)  15 mL Mouth Rinse BID  . feeding supplement (PRO-STAT SUGAR FREE 64)  30 mL Per Tube Daily  . free water  200 mL Per Tube Q4H  . insulin aspart  0-9 Units Subcutaneous Q4H  . insulin aspart  2 Units Subcutaneous Q4H  . insulin glargine  12 Units Subcutaneous BID  . ipratropium-albuterol  3 mL Nebulization QID  . lisinopril  20 mg Per Tube BID  . mouth rinse  15 mL Mouth Rinse 10 times per day  . multivitamin with minerals  1 tablet Per Tube Daily  . pantoprazole sodium  40 mg Per Tube Daily  . QUEtiapine  50 mg Per Tube BID    Continuous Infusions: . sodium chloride 50 mL/hr at 09/02/18 1200  . feeding supplement (VITAL HIGH PROTEIN) 45 mL/hr at 09/02/18 0730  . propofol (DIPRIVAN)  infusion Stopped (09/02/18 1133)    PRN Meds: acetaminophen **OR** acetaminophen (TYLENOL) oral liquid 160 mg/5 mL **OR** acetaminophen, fentaNYL (SUBLIMAZE) injection, hydrALAZINE, senna-docusate  Physical Exam        Elderly female, lethargic, eyes closed, intubated. Moves arms and legs when provoked.  Attempts to follow commands.   Vital Signs: BP (!) 158/52   Pulse 89   Temp 99 F (37.2 C) (Axillary)   Resp 20   Ht '5\' 3"'  (1.6 m)   Wt 92.8 kg   SpO2 100%   BMI 36.24 kg/m  SpO2: SpO2: 100 % O2 Device: O2 Device: Ventilator O2 Flow Rate:    Intake/output summary:   Intake/Output Summary (Last 24 hours) at 09/02/2018 1335 Last data filed at 09/02/2018 1200 Gross per 24 hour  Intake 2489.46 ml  Output 750 ml  Net 1739.46 ml   LBM: Last BM Date: (pta) Baseline Weight: Weight: 88.6 kg Most recent weight: Weight: 92.8 kg       Palliative Assessment/Data:  10%    Flowsheet Rows     Most Recent Value  Intake Tab  Referral Department  Critical care  Unit at Time of Referral  ICU  Palliative Care Primary Diagnosis  Neurology  Date Notified  08/30/18  Palliative Care Type  New Palliative care  Reason for referral  Clarify Goals of Care  Date of Admission  08/24/2018  Date first seen by Palliative Care  08/30/18  # of days Palliative referral response time  0 Day(s)  # of days IP prior to Palliative referral  5  Clinical Assessment  Palliative Performance Scale Score  10%  Psychosocial & Spiritual Assessment  Palliative Care Outcomes  Patient/Family meeting held?  Yes  Who was  at the meeting?  daughter  Palliative Care Outcomes  Provided end of life care assistance, Provided psychosocial or spiritual support      Patient Active Problem List   Diagnosis Date Noted  . Pressure injury of skin 09/01/2018  . Acute respiratory insufficiency   . Palliative care encounter   . Respiratory failure ([place])   . Goals of care, counseling/discussion   . Palliative care by  specialist   . Endotracheally intubated 08/26/2018  . Hypertension, essential 08/26/2018  . COPD without exacerbation ([place]) 08/26/2018  . Intracerebral hemorrhage ([place]) 08/26/2018  . 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  Please consider allowing family to be here for the extubation if there is any concern it will not be successful.  I will add low dose robinul PRN to help with secretions.  Patient is DNR.    Family intends to take her home.  They will provide 24 hour care.  Goals of Care and Additional Recommendations:  Limitations on Scope of Treatment: Full Scope Treatment  Code Status:  DNR and no reintubation.  Prognosis:   Unable to determine.  If patient extubates successfully she will likely have weeks to months depending on how well she swallows.   Discharge Planning:  To Be Determined  Care plan was discussed with CCM NP, bedside RN, Family.  Thank you for allowing the Palliative Medicine Team to assist in the care of this patient.  Total time spent:  60 min.     Greater than 50%  of this time was spent counseling and coordinating care related to the above assessment and plan.  Florentina Jenny, PA-C Palliative Medicine  Please contact Palliative MedicineTeam phone at [phone] for questions and concerns between 7 am - 7 pm.   Please see AMION for individual provider pager numbers.

## 2018-09-02 NOTE — Progress Notes (Signed)
NAME:  Beth Pope, MRN:  [id number], DOB:  1941-01-13, LOS: 8 ADMISSION DATE:  2018-08-28, CONSULTATION DATE:  08/26/18 REFERRING MD:  Aroor  CHIEF COMPLAINT:  AMS   Brief History   Beth Pope is a 78 y.o. female who was admitted 3/23 with concern for CVA (NIH 6).  She received tPA and later had ICH.  She required intubation and was then transferred to the neuro ICU. She received 1g of TXA and was then transferred to the neuro ICU and PCCM was asked to see in consultation.  Past Medical History  HTN, COPD, DM.  Significant Hospital Events   3/23 Admit. 3/24 Intubated after ICH following tPA. 3/26 Remains on vent, PSV wean 10/5 08/29/2018 not extubated at this time. 3/28 seen by palliative care. Plan to cont supportive care but working towards w/d 3/30: Family meeting.  Working towards goals of care. 3/31: Added gentle free water replacement, adding Seroquel in effort to get her off from propofol  Consults:  PCCM.  Procedures:  ETT 3/24 >> L radial a line 3/24 >> removed  Significant Diagnostic Tests:  CT head 3/23 > negative. CT head 3/24 > multifocal IPH and SAH. CT head 3/24 > worsening ICH with new IPH.  Slightly increased size of multiple other small foci of hemorrhage. MRI/MRA brain 3/24 > increase in both size and number of multiple intracranial hemorrhages of an acute nature, MRA with no flow limiting stenosis or acute findings  Echo 3/24 > LVEF 65-70%, mild LVH, grade 1 DD  Micro Data:  Tracheal Aspirate 3/24 >>  MRSA PCR neg 3/24  Antimicrobials:     Interim history/subjective:  No acute issues overnight  Objective:  Blood pressure 102/61, pulse 73, temperature 98.5 F (36.9 C), temperature source Axillary, resp. rate 16, height 5\' 3"  (1.6 m), weight 92.8 kg, SpO2 98 %.    Vent Mode: PRVC FiO2 (%):  [30 %] 30 % Set Rate:  [16 bmp] 16 bmp Vt Set:  [420 mL] 420 mL PEEP:  [5 cmH20] 5 cmH20 Pressure Support:  [12 cmH20] 12 cmH20 Plateau Pressure:   [15 cmH20-22 cmH20] 22 cmH20   Intake/Output Summary (Last 24 hours) at 09/02/2018 0906 Last data filed at 09/02/2018 0800 Gross per 24 hour  Intake 2259.9 ml  Output 750 ml  Net 1509.9 ml   Filed Weights   08/30/18 0336 08/31/18 0500 09/02/18 0500  Weight: 91.6 kg 92.7 kg 92.8 kg    Examination:  General: This is an 78 year old female patient who remains on mechanical ventilation HEENT normocephalic atraumatic orally intubated Pulmonary: Occasional rhonchi no accessory use equal chest rise on mechanical assisted breath. Cardiac: Regular rate and rhythm without murmur rub or gallop Abdomen: Soft not tender tolerating tube feeds Extremities: Warm and dry brisk capillary refill Neuro: Opens eyes, briskly localizes, moves all extremities.  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  S/p 1g TXA Plan Complete 7 days of Keppra  Continue amlodipine, lisinopril, Coreg  Adding low-dose Seroquel to see if we can get her off from propofol    Acute Respiratory Insufficiency acute hypoxemic respiratory failure requiring intubation mechanical ventilation -due to inability to protect the airway in the setting of CVA / ICH. Portable chest x-ray personally reviewed:Rotated film.  Endotracheal tube in satisfactory position.  No clear infiltrates, basilar volume  loss. Plan Pressure support as tolerated  VAP bundle  Appreciate palliative care input, awaiting a second family meeting scheduled for today  Suspect we will work towards extubation with no plans for reintubation  continue supportive care   Fluid and electrolyte imbalance: hypokalemia, hypernatremia Plan Replace free water Replace potassium A.m. chemistry  Anemia  Plan Trend CBC  Thrombocytopenia  Plan Trend CBC  DM w/ hyperglycemia   Plan Sliding scale insulin  At Risk Malnutrition  Plan Continuing tube feeds    Best Practice:  Diet: NPO / TF    Pain/Anxiety/Delirium protocol (if indicated): Currently not on sedation VAP protocol (if indicated): In place. DVT prophylaxis: SCD's. GI prophylaxis: PPI. Glucose control: SSI. Mobility: Bedrest. Code Status: Made DNR by neurology 08/27/2018 Family Communication: 08/29/2018 no family at bedside Disposition: She remains critically ill due to her need for mechanical ventilation, ineffective airway clearance, and neurological deficits following life-threatening CVA.  Ongoing discussions are being held with palliative care with family.  Apparently patient would not want tracheostomy, feeding tube, or nursing home which is likely the course should we continue.  Awaiting final discussion but would favor optimization of current medical status with plan for one-way extubation and no reintubation should she decline  My critical care x32 minutes Simonne Martinet ACNP-BC [facility] Pulmonary/Critical Care Pager # [phone] OR # [phone] if no answer

## 2018-09-02 NOTE — Progress Notes (Signed)
0800 CBG 44, gave IV dextrose per protocol, rechecked, CBG 151

## 2018-09-02 NOTE — Progress Notes (Signed)
Dr. Pearlean Brownie changed her mNIHSS to Q4.

## 2018-09-03 ENCOUNTER — Inpatient Hospital Stay (HOSPITAL_COMMUNITY): Payer: Medicare Other

## 2018-09-03 LAB — COMPREHENSIVE METABOLIC PANEL
ALK PHOS: 81 U/L (ref 38–126)
ALT: 28 U/L (ref 0–44)
AST: 25 U/L (ref 15–41)
Albumin: 2 g/dL — ABNORMAL LOW (ref 3.5–5.0)
Anion gap: 6 (ref 5–15)
BUN: 16 mg/dL (ref 8–23)
CALCIUM: 8.9 mg/dL (ref 8.9–10.3)
CO2: 27 mmol/L (ref 22–32)
Chloride: 114 mmol/L — ABNORMAL HIGH (ref 98–111)
Creatinine, Ser: 0.63 mg/dL (ref 0.44–1.00)
GFR calc Af Amer: >60 mL/min (ref >60)
Glucose, Bld: 87 mg/dL (ref 70–99)
Potassium: 3.5 mmol/L (ref 3.5–5.1)
Sodium: 147 mmol/L — ABNORMAL HIGH (ref 135–145)
TOTAL PROTEIN: 5.2 g/dL — AB (ref 6.5–8.1)
Total Bilirubin: 0.4 mg/dL (ref 0.3–1.2)

## 2018-09-03 LAB — CBC
HEMATOCRIT: 27.4 % — AB (ref 36.0–46.0)
Hemoglobin: 8.1 g/dL — ABNORMAL LOW (ref 12.0–15.0)
MCH: 21.3 pg — ABNORMAL LOW (ref 26.0–34.0)
MCHC: 29.6 g/dL — ABNORMAL LOW (ref 30.0–36.0)
MCV: 72.1 fL — AB (ref 80.0–100.0)
Platelets: 223 10*3/uL (ref 150–400)
RBC: 3.8 MIL/uL — ABNORMAL LOW (ref 3.87–5.11)
RDW: 14.1 % (ref 11.5–15.5)
WBC: 12 10*3/uL — ABNORMAL HIGH (ref 4.0–10.5)
nRBC: 0 % (ref 0.0–0.2)

## 2018-09-03 LAB — GLUCOSE, CAPILLARY
Glucose-Capillary: 71 mg/dL (ref 70–99)
Glucose-Capillary: 124 mg/dL — ABNORMAL HIGH (ref 70–99)

## 2018-09-03 MED ORDER — MORPHINE SULFATE (PF) 2 MG/ML IV SOLN
INTRAVENOUS | Status: AC
  Filled 2018-09-03: qty 1

## 2018-09-03 MED ORDER — RACEPINEPHRINE HCL 2.25 % IN NEBU
INHALATION_SOLUTION | RESPIRATORY_TRACT | Status: AC
  Administered 2018-09-03: 12:00:00
  Filled 2018-09-03: qty 0.5

## 2018-09-03 MED ORDER — QUETIAPINE FUMARATE 25 MG PO TABS: 50.0000 mg | ORAL_TABLET | Freq: Every day | ORAL | Status: DC

## 2018-09-03 MED ORDER — FUROSEMIDE 10 MG/ML IJ SOLN: 40.0000 mg | Freq: Once | INTRAMUSCULAR | Status: DC

## 2018-09-03 MED ORDER — POTASSIUM CHLORIDE 20 MEQ/15ML (10%) PO SOLN: 40.0000 meq | ORAL | Status: DC

## 2018-09-03 MED ORDER — FREE WATER: 300.0000 mL | Status: DC

## 2018-09-03 MED ORDER — POTASSIUM CHLORIDE 20 MEQ/15ML (10%) PO SOLN: 40.0000 meq | Freq: Once | ORAL | Status: DC

## 2018-09-03 MED ORDER — QUETIAPINE FUMARATE 25 MG PO TABS: 25.0000 mg | ORAL_TABLET | Freq: Every day | ORAL | Status: DC

## 2018-09-03 DEATH — deceased

## 2018-09-05 LAB — CULTURE, RESPIRATORY W GRAM STAIN

## 2018-10-03 NOTE — Progress Notes (Signed)
Patient's urine output for shift only , bladder scan showed 524 in bladder. CCM MD notified, order for I/O cath obtained.   Aris Lot, RN

## 2018-10-03 NOTE — Procedures (Signed)
Extubation Procedure Note  Patient Details:   Name: Aizlyn Gotch DOB: 1940/06/10 MRN: [id number]   Airway Documentation:    Vent end date: 09/13/18 Vent end time: 1130   Evaluation  O2 sats: transiently fell during during procedure Complications: Complications of desat, stridor Patient did tolerate procedure well. Bilateral Breath Sounds: Rhonchi   No   Patient one-way extubated per MD order per family request.  After removal of ETT and placed on 4L nasal cannula, patients sats began to drop to low 80s.  Stridor audible.  MD paged and instructed to give one time dose racemic.  Upon arrival back to room after removing medication noted that patient's sats were in 30s and heart rate dropped to the 30s.  Gave racemic and RN instructed to give 1mg  morphine.    Elyn Peers Sep 13, 2018, 12:05 PM

## 2018-10-03 NOTE — Progress Notes (Signed)
CDS called to confirm they had received patient's TOD. Referral #[id number]

## 2018-10-03 NOTE — Procedures (Signed)
Chaplain offered ministry of presence to daughter, niece, and son-in-law in hallway.  Chaplain offered words of scripture and prayer bedside with family when they were brought into room. Will be available if other family member desires visit. Rev. Lynnell Chad Pager [phone]

## 2018-10-03 NOTE — Death Summary Note (Addendum)
Patient ID: Beth Pope MRN: [id number] DOB/AGE: 01-03-1941 78 y.o.  Admit date: 09-11-2018 Death date: 09/20/2018  Admission Diagnoses:Right-sided weakness, slurred speech  Cause of Death:  Respiratory failure secondary to post TPA intracerebral hemorrhage with left brain infarct with cytotoxic edema. Patient made DO NOT RESUSCITATE and comfort care by Family and ventilatory support withdrawn  Pertinent Medical Diagnosis: Principal Problem:   Stroke (cerebrum) (HCC) Active Problems:   Endotracheally intubated   Hypertension, essential   COPD without exacerbation (HCC)   ICH (intracerebral hemorrhage) (HCC)   Respiratory failure (HCC)   Goals of care, counseling/discussion   Palliative care by specialist   Pressure injury of skin   Acute respiratory insufficiency   Palliative care encounter   Hospital Course: Beth Pope is an 78 y.o. female past medical history of diabetes mellitus, COPD, hypertension presents to the emergency room as a code stroke for sudden onset right-sided weakness, facial droop and slurred speech.  Last known normal was 9:30 PM.  Patient was her normal self and talking when she suddenly developed slurred speech and felt weak on the right side.  According to EMS blood pressure was 230 systolic in route.   On arrival patient had a NIH stroke scale of 6 and stat head CT was negative for hemorrhage.  Discussed with patient risk versus benefit of IV TPA as well as with daughter over the phone and TPA was administered. Patient received 10 mg of IV labetalol prior to CT head repeat blood pressure was 128/100 just prior to receiving IV TPA.  Blood pressure was repeated on the left arm and was 190 systolic and patient received an additional 10 mg of IV labetalol and blood pressure improved to below 180/110 mmHg.  Ed Course:  Reassessed the patient around 28 PM.  Patient strength in right upper and lower extremity had improved.  Blood pressure was 178/ 77 mmHg  at the time and remained below 180/105 mmHg. Nurse was notified to call if blood pressure greater than 180.   At 11:14 PM received a call stating the blood pressure was 197/71 mmHg. Asked nurse to give 10 mg more of labetalol and ordered Cleviprex to keep blood pressure below 180 systolic.  Around this time, however patient became diaphoretic and increased respirations per nurse and ED PA was notified.  Also blood pressure monitor was not picking up readings per nurse and this time she needed to get another machine.  I was called by patient's nurse again at 11:46 PM stating the above and declining mental status.  Arrived immediately and asked if patient was having headache to which she said yes.  tPA already completed at the time.  Additional labetalol was administered and patient was immediately taken to CT scanner which showed patient had multiple focal subarachnoid hemorrhages without midline shift.  Was emergently notified to mix TXA and this was administered at 12:18 AM.  Blood pressure was attempted to be recorded but difficult as patient was moving.  Patient intubated for airway protection as she was becoming very agitated and confused.  Also continued to have wide fluctuation in readings varying from 100 systolic to 239 systolic. After receiving multiple doses of labetalol and starting her on Cardene drip blood pressure was brought down below 140 systolic. Arterial line was placed to get more accurate blood pressure readings.date last known well 09/11/2018 at 9:30 PM. NIH stroke scale on admission 6. Baseline modified Rankin scale score of 0.patient did not do well and remained intubated and in ICU. She  was subsequently found to be following simple commands but had obvious right-sided weakness. After several meetings with the patient's family and critical care team to decided that patient probably would not want prolonged ventilatory support and family agreed to DO NOT RESUSCITATE and 1 day extubation. Patient  was extubated on 09/24/2018 with critical care team and did not do well and passed away shortly thereafter. Patient was DO NOT RESUSCITATE. Family notified.  Code Stroke CT head 3/23 2225 No acute stroke. ASPECTS 10.   CT head 3/24 0006 multifocal B IPH and SAH. No mass effect  CT head  3/24 0144 worsening ICH w/ new R temporal hmg 70mL. slightly increased size of other small ICH with unchanged SAH. No shift.  MRI 3/24 1458 stable in size and number of mult IC hmgs.  MRA  Unremarkable   CT head 3/24 2113 unchanged size, distribution hmgs  Carotid Doppler B ICA 1-39% stenosis, VAs antegrade   2D Echo  EF > 65%. No source of embolus   LDL 102  HgbA1c 10.4 Fibrinogen 336 s/p TXA  Signed: Delia Heady 09/27/2018, 1:56 PM

## 2018-10-03 NOTE — Progress Notes (Signed)
RT note: patient placed on CPAP/PSV of 15/5 at 0819.  Currently tolerating well.  Will continue to monitor.

## 2018-10-03 NOTE — Progress Notes (Addendum)
NAME:  Beth Pope, MRN:  [id number], DOB:  1940-11-21, LOS: 9 ADMISSION DATE:  2018/08/28, CONSULTATION DATE:  08/26/18 REFERRING MD:  Aroor  CHIEF COMPLAINT:  AMS   Brief History   Beth Pope is a 78 y.o. female who was admitted 3/23 with concern for CVA (NIH 6).  She received tPA and later had ICH.  She required intubation and was then transferred to the neuro ICU. She received 1g of TXA and was then transferred to the neuro ICU and PCCM was asked to see in consultation.  Past Medical History  HTN, COPD, DM.  Significant Hospital Events   3/23 Admit. 3/24 Intubated after ICH following tPA. 3/26 Remains on vent, PSV wean 10/5 08/29/2018 not extubated at this time. 3/28 seen by palliative care. Plan to cont supportive care but working towards w/d 3/30: Family meeting.  Working towards goals of care. 3/31: Added gentle free water replacement, adding Seroquel in effort to get her off from propofol 4/1: One-way extubation.  Family notified, proceeding once family arrives. Consults:  PCCM.  Procedures:  ETT 3/24 >> L radial a line 3/24 >> removed  Significant Diagnostic Tests:  CT head 3/23 > negative. CT head 3/24 > multifocal IPH and SAH. CT head 3/24 > worsening ICH with new IPH.  Slightly increased size of multiple other small foci of hemorrhage. MRI/MRA brain 3/24 > increase in both size and number of multiple intracranial hemorrhages of an acute nature, MRA with no flow limiting stenosis or acute findings  Echo 3/24 > LVEF 65-70%, mild LVH, grade 1 DD  Micro Data:  Tracheal Aspirate 3/24 >>  MRSA PCR neg 3/24  Antimicrobials:     Interim history/subjective:  No distress. Vts in high 200s to low 300s on PSV of 5. No distress.   Objective:  Blood pressure (Abnormal) 130/48, pulse 82, temperature 100.2 F (37.9 C), temperature source Axillary, resp. rate (Abnormal) 22, height 5\' 3"  (1.6 m), weight 95.6 kg, SpO2 97 %.    Vent Mode: PSV;CPAP FiO2 (%):  [30  %-40 %] 30 % Set Rate:  [16 bmp] 16 bmp Vt Set:  [420 mL] 420 mL PEEP:  [5 cmH20] 5 cmH20 Pressure Support:  [12 cmH20-15 cmH20] 15 cmH20 Plateau Pressure:  [15 cmH20-18 cmH20] 18 cmH20   Intake/Output Summary (Last 24 hours) at 09/28/2018 0853 Last data filed at 09/24/2018 0800 Gross per 24 hour  Intake 3491.49 ml  Output 1025 ml  Net 2466.49 ml   Filed Weights   08/31/18 0500 09/02/18 0500 09/15/2018 0500  Weight: 92.7 kg 92.8 kg 95.6 kg    Examination:  General: 78 year old female who remains ventilatory dependent intermittently agitated follows commands intermittently no distress. HEENT normocephalic atraumatic no jugular venous distention orally intubated Pulmonary: Coarse scattered rhonchi no accessory use.  Triggered apnea alarm initially. Now w/ Vts in 200s-300s on PSV 5. No accessory use  Card RRR abd Not tender tol TFs Ext moves all ext brisk CR Neuro follows commands at times. 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  S/p 1g TXA Plan Dc keppra today (s/p 7 days per neuro) Cont amlodipine, lisinopril and coreg Change Seroquel -> lower dose in am  Will place nasogastric tube so that she can receive her medications   Acute Respiratory Insufficiency acute hypoxemic respiratory failure requiring intubation mechanical ventilation  -due to inability to protect the  airway in the setting of CVA / ICH. pcxr w/ L>R vol loss. Can't exclude element of edema as well.  -Her F/ VT ratio is borderline.  Although I do not think there is anything else to add at this point. Plan Lasix x 1 now Proceed with extubation with no reintubation once family arrives Keep n.p.o. Pulse oximetry and oxygen as indicated Prepared to transition to comfort  Fluid and electrolyte imbalance: hypokalemia, hypernatremia Plan Free water replacement Replace potassium  Anemia  Plan  Trend CBC  Thrombocytopenia  -plts stable  Plan Trend CBC  DM w/ hyperglycemia  Plan Sliding scale insulin  At Risk Malnutrition  Plan Continue tube feeds    Best Practice:  Diet: NPO / TF    Pain/Anxiety/Delirium protocol (if indicated): Currently not on sedation VAP protocol (if indicated): In place. DVT prophylaxis: SCD's. GI prophylaxis: PPI. Glucose control: SSI. Mobility: Bedrest. Code Status: Made DNR by neurology 08/27/2018 Family Communication: 08/29/2018 no family at bedside Disposition: I think she is about as good as she can get.  Appreciate palliative care assistance.  She remains critically ill due to her ineffective airway protection and need for mechanical ventilation.  Her frequency tidal volume ratio is borderline but I do not think there is anything else to add at this point.  We will proceed with one-way extubation once family arrives  My critical care x31 minutes Simonne Martinet ACNP-BC [facility] Pulmonary/Critical Care Pager # [phone] OR # [phone] if no answer

## 2018-10-03 NOTE — Progress Notes (Signed)
Neita Goodnight, RN and Philis Nettle, RN auscultated and pronounce time of death at 12:08, Sep 19, 2018.  RN, RT and family members present.  Attending physician, CDS, [place] notified.

## 2018-10-03 DEATH — deceased

## 2018-10-13 LAB — BLOOD GAS, ARTERIAL

## 2020-11-01 IMAGING — CT CT HEAD CODE STROKE W/O CM
4 series · 16 of 47 positions shown, 18 images · non-contrast
Comparison: 02/10/2018

CLINICAL DATA: Code stroke. Right-sided weakness. Last seen normal
[DATE].

EXAM:
CT HEAD WITHOUT CONTRAST
TECHNIQUE: Contiguous axial images were obtained from the base of the skull
through the vertex without intravenous contrast.

[Series 3: head wo · axial · 0.41mm/px · z∈[+1343,+1463]mm · 7 of 33 slices shown, 9 images]
[im 5/33  brain]
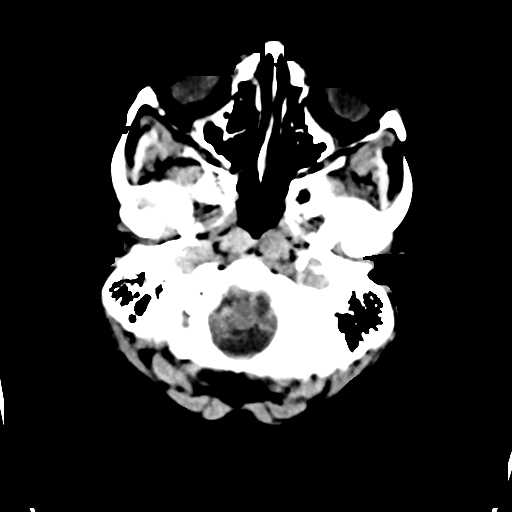
[im 5/33  bone]
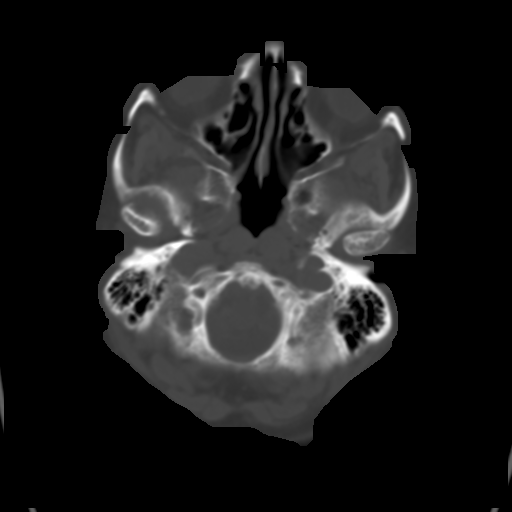
[im 9/33  brain]
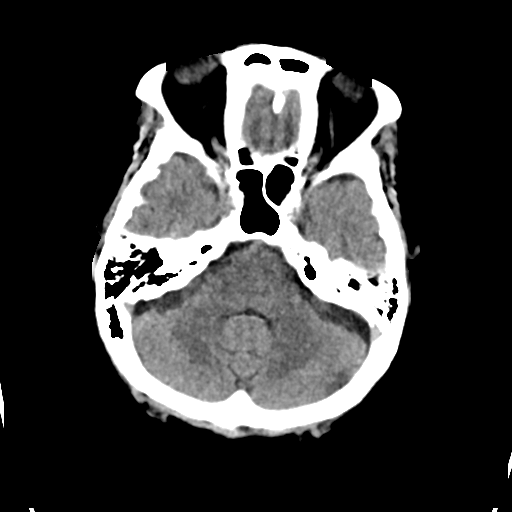
[im 13/33  brain]
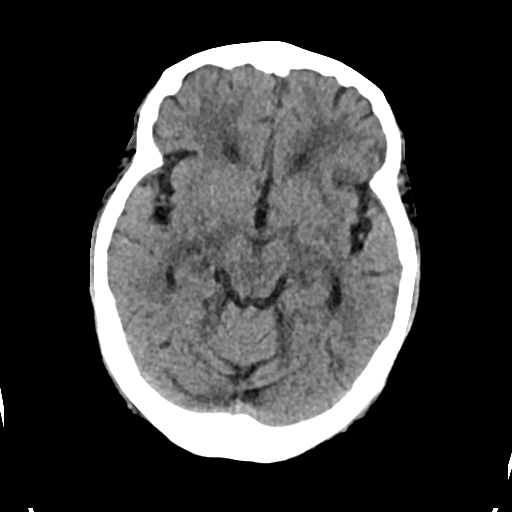
[im 17/33  brain]
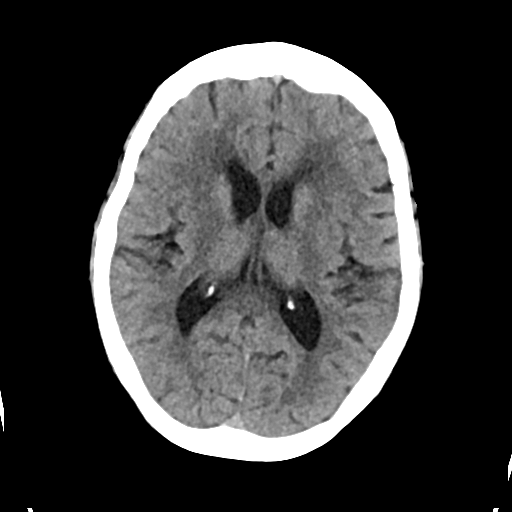
[im 21/33  brain]
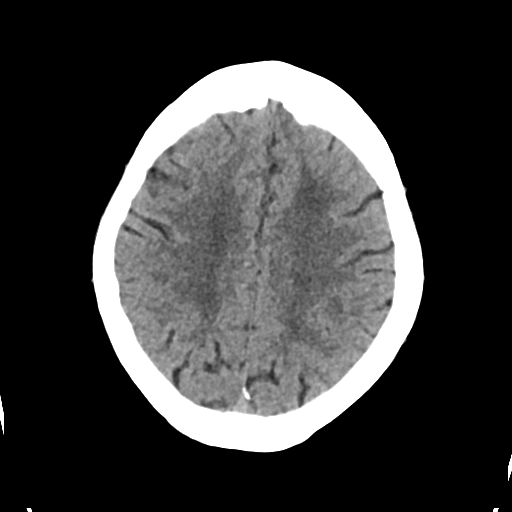
[im 21/33  bone]
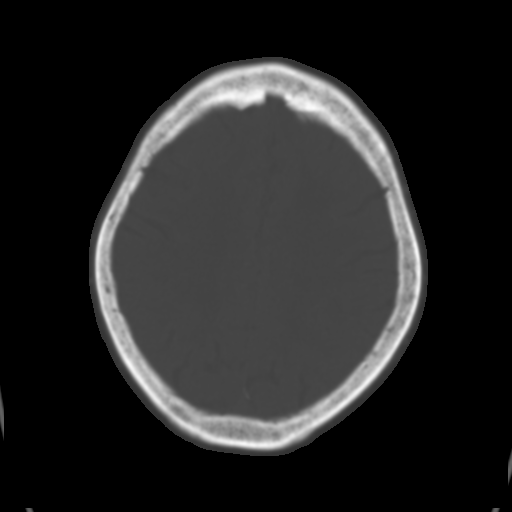
[im 25/33  brain]
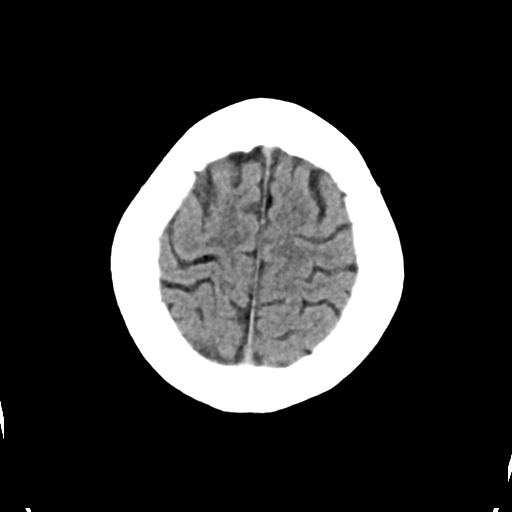
[im 29/33  brain]
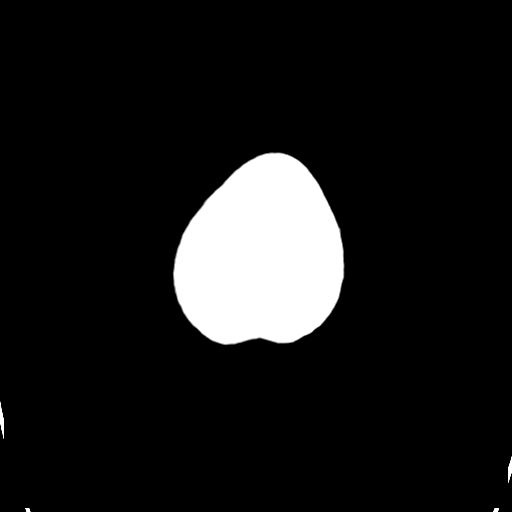

[Series 4: head bone · axial · 0.41mm/px · z∈[+1339,+1371]mm · 3 of 81 slices shown]
[im 9/81  bone]
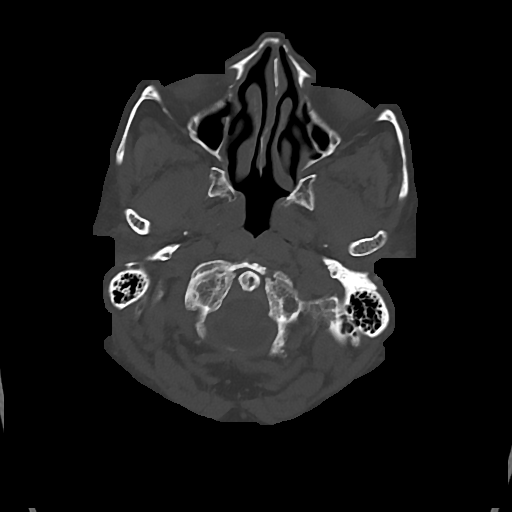
[im 17/81  bone]
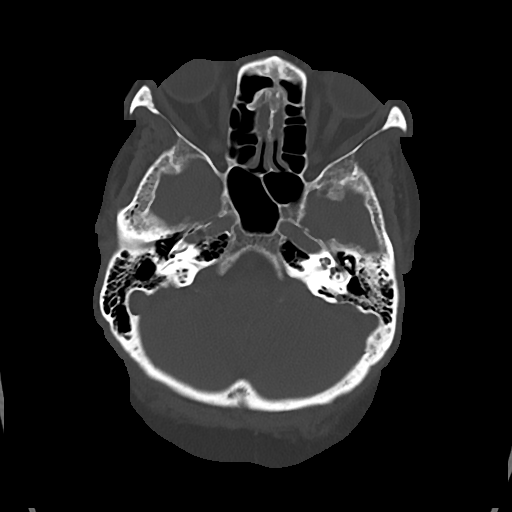
[im 25/81  bone]
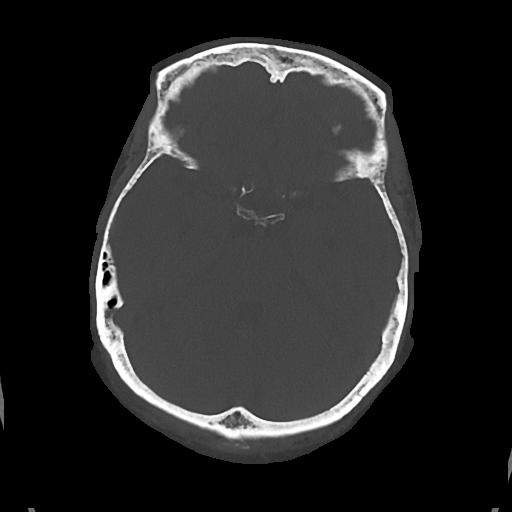

[Series 5: cor soft · coronal · 0.32mm/px · 3 of 67 slices shown]
[im 23/67  brain]
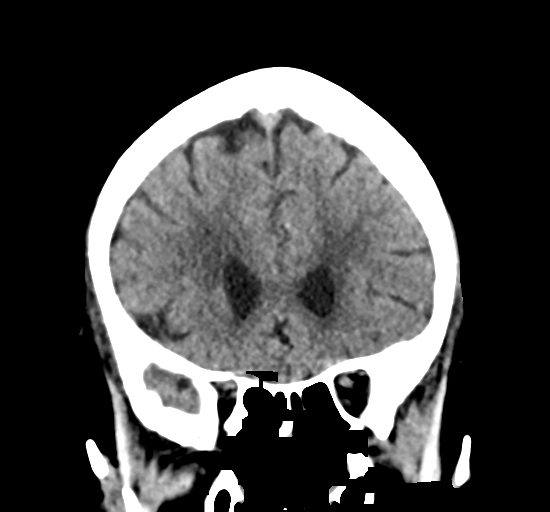
[im 30/67  brain]
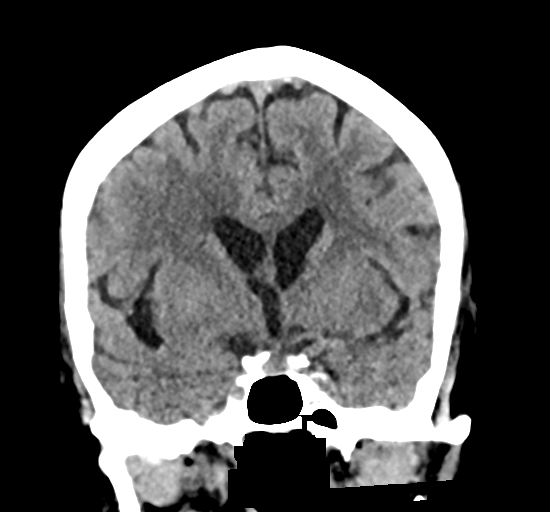
[im 37/67  brain]
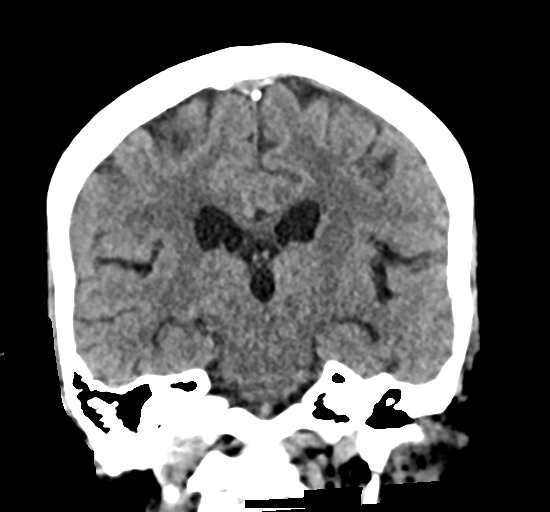

[Series 6: sag soft · sagittal · 0.32mm/px · 3 of 55 slices shown]
[im 19/55  brain]
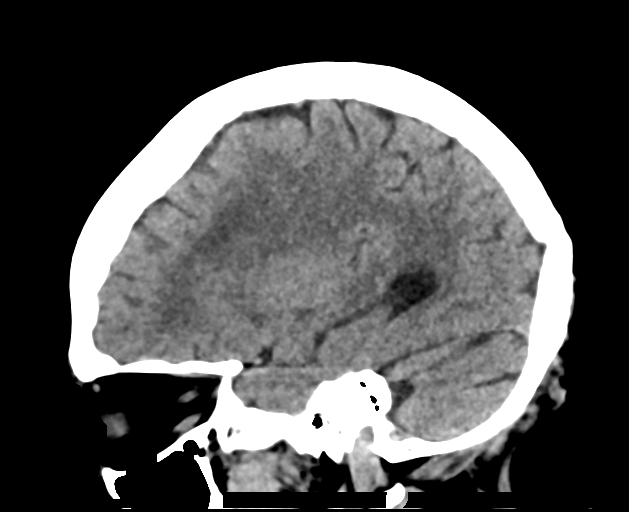
[im 28/55  brain]
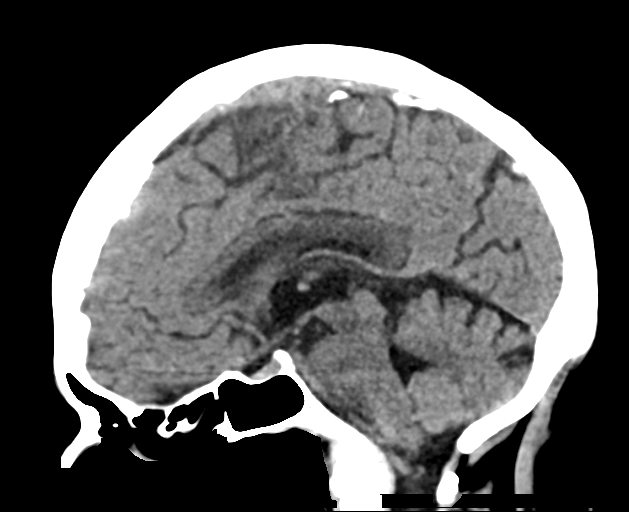
[im 37/55  brain]
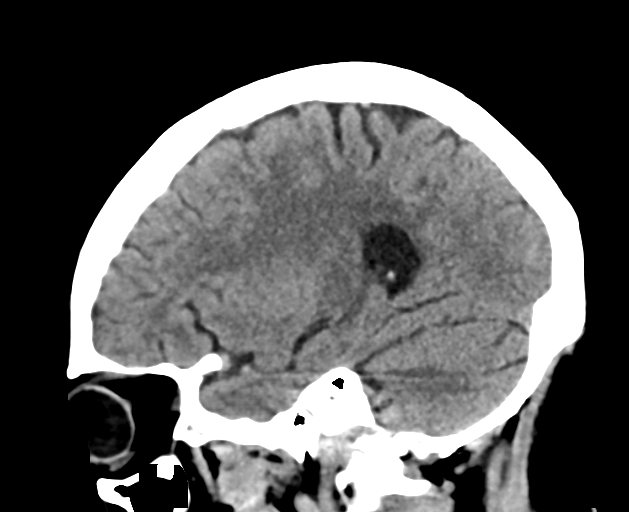

[16 of 47 positions shown; findings below may reference images not displayed]

FINDINGS: Brain: There is no mass, hemorrhage or extra-axial collection. The
size and configuration of the ventricles and extra-axial CSF spaces
are normal. There is hypoattenuation of the periventricular white
matter, most commonly indicating chronic ischemic microangiopathy.

Vascular: No abnormal hyperdensity of the major intracranial
arteries or dural venous sinuses. No intracranial atherosclerosis.

Skull: The visualized skull base, calvarium and extracranial soft
tissues are normal.

Sinuses/Orbits: No fluid levels or advanced mucosal thickening of
the visualized paranasal sinuses. No mastoid or middle ear effusion.
The orbits are normal.

ASPECTS (Alberta Stroke Program Early CT Score)

- Ganglionic level infarction (caudate, lentiform nuclei, internal
capsule, insula, M1-M3 cortex): 7

- Supraganglionic infarction (M4-M6 cortex): 3

Total score (0-10 with 10 being normal): 10
IMPRESSION: 1. No acute intracranial abnormality.
2. ASPECTS is 10.
* These results were communicated to Dr. Odin Devoe at [DATE]
on 08/25/2018 by text page via the [employer] messaging system.

## 2020-11-10 IMAGING — DX PORTABLE CHEST - 1 VIEW
1 series · 1 of 1 positions shown · non-contrast
Comparison: 09/02/2018

CLINICAL DATA: Acute respiratory failure

EXAM:
PORTABLE CHEST 1 VIEW

[chest ap]
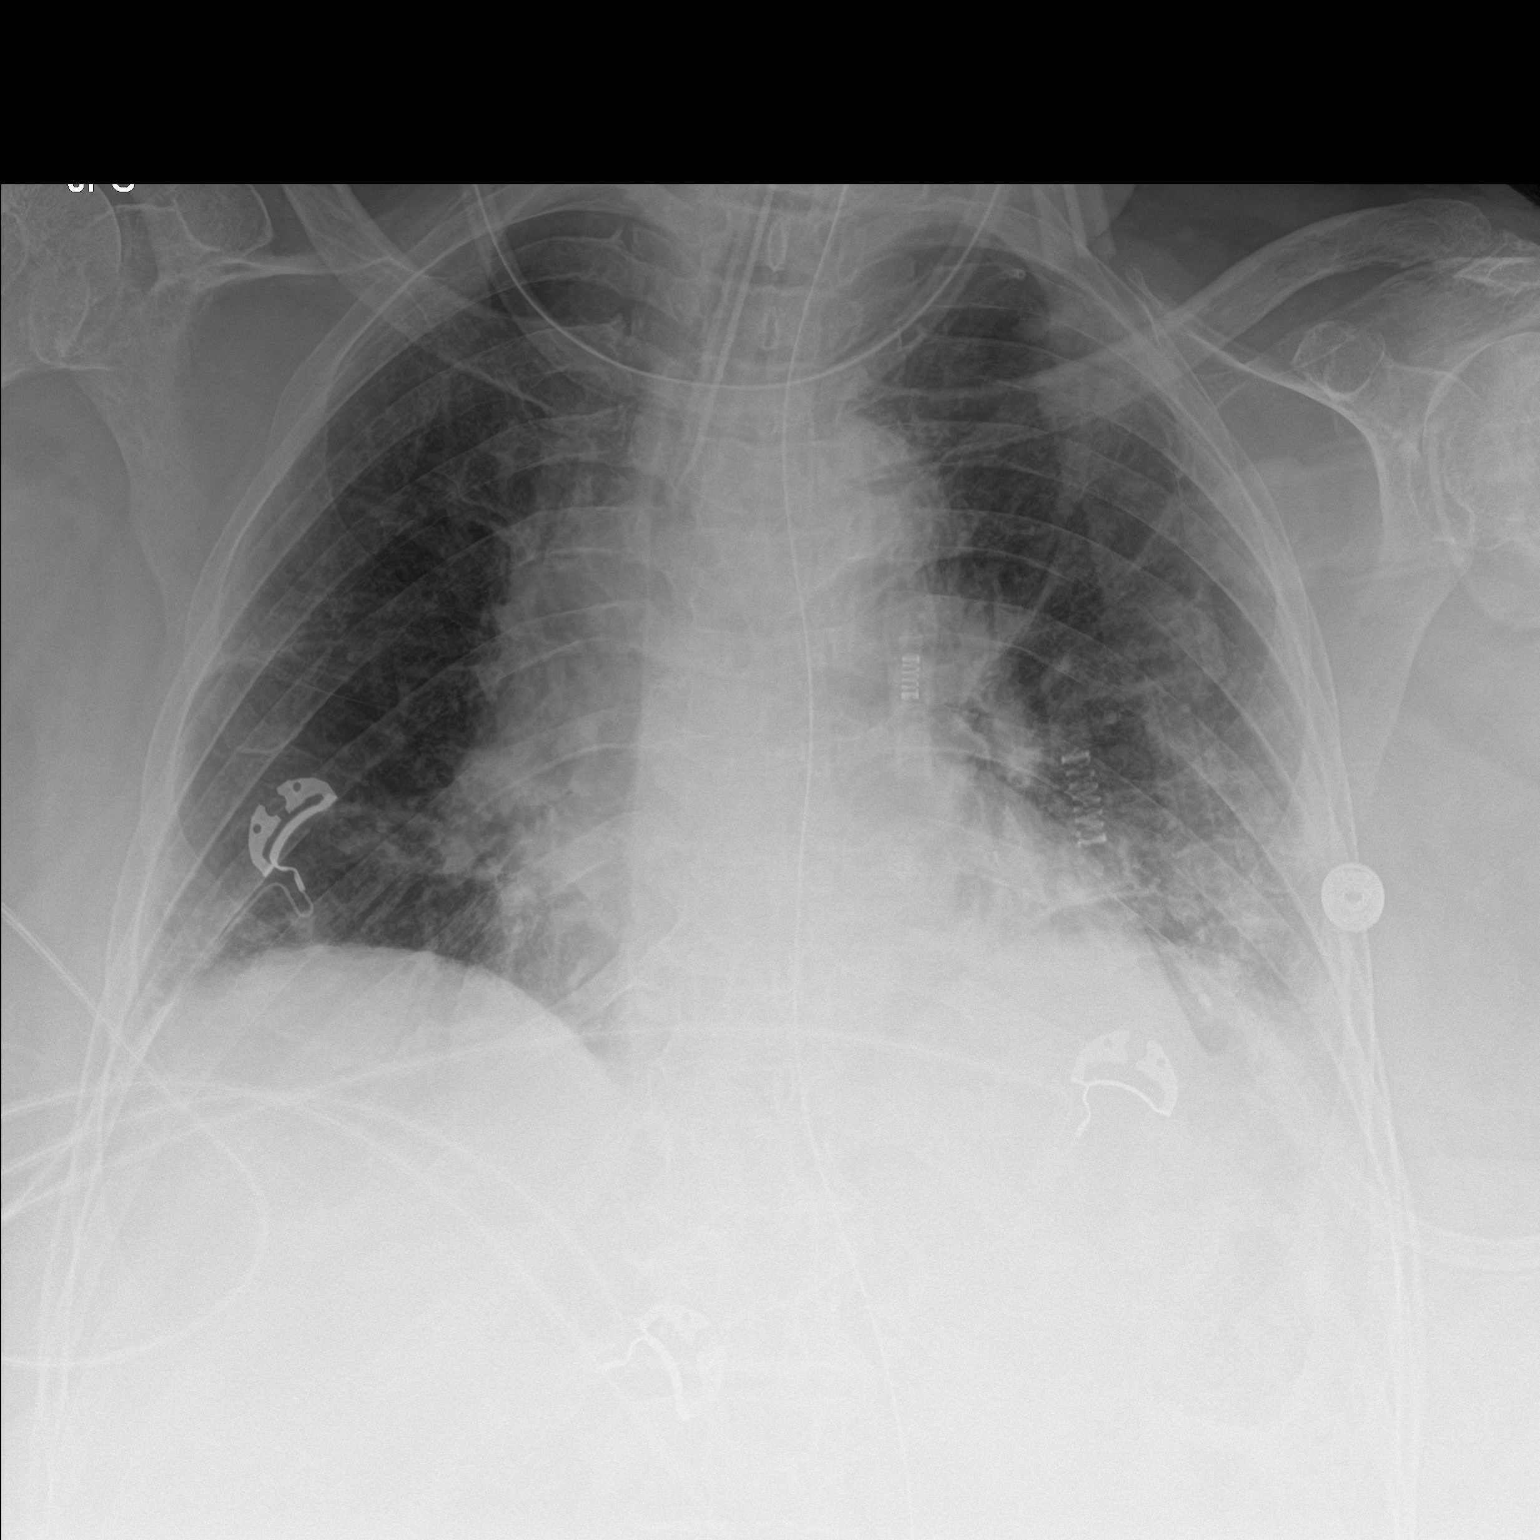

[1 of 1 positions shown; findings below may reference images not displayed]

FINDINGS: Endotracheal tube and NG tube are unchanged. Cardiomegaly. Patchy
bibasilar airspace opacities of increased since prior study. No
effusions or acute bony abnormality.
IMPRESSION: Increasing bibasilar airspace opacities.  Cannot exclude pneumonia.
